# Patient Record
Sex: Male | Born: 1970 | Race: Black or African American | Hispanic: No | Marital: Married | Smoking: Former smoker
Health system: Southern US, Community
[De-identification: ages and names within clinical notes are randomized; demographics above are authoritative.]

## PROBLEM LIST (undated history)

## (undated) DIAGNOSIS — E669 Obesity, unspecified: Secondary | ICD-10-CM

## (undated) DIAGNOSIS — R12 Heartburn: Secondary | ICD-10-CM

## (undated) DIAGNOSIS — Z87891 Personal history of nicotine dependence: Secondary | ICD-10-CM

## (undated) DIAGNOSIS — W3400XA Accidental discharge from unspecified firearms or gun, initial encounter: Secondary | ICD-10-CM

## (undated) DIAGNOSIS — F1911 Other psychoactive substance abuse, in remission: Secondary | ICD-10-CM

## (undated) DIAGNOSIS — G4733 Obstructive sleep apnea (adult) (pediatric): Secondary | ICD-10-CM

## (undated) DIAGNOSIS — F1011 Alcohol abuse, in remission: Secondary | ICD-10-CM

## (undated) DIAGNOSIS — E119 Type 2 diabetes mellitus without complications: Secondary | ICD-10-CM

## (undated) DIAGNOSIS — M75121 Complete rotator cuff tear or rupture of right shoulder, not specified as traumatic: Secondary | ICD-10-CM

## (undated) DIAGNOSIS — I10 Essential (primary) hypertension: Secondary | ICD-10-CM

## (undated) DIAGNOSIS — S73006A Unspecified dislocation of unspecified hip, initial encounter: Secondary | ICD-10-CM

## (undated) DIAGNOSIS — Z8619 Personal history of other infectious and parasitic diseases: Secondary | ICD-10-CM

## (undated) DIAGNOSIS — Y249XXA Unspecified firearm discharge, undetermined intent, initial encounter: Secondary | ICD-10-CM

## (undated) DIAGNOSIS — E1169 Type 2 diabetes mellitus with other specified complication: Secondary | ICD-10-CM

## (undated) HISTORY — DX: Unspecified dislocation of unspecified hip, initial encounter: S73.006A

## (undated) HISTORY — DX: Accidental discharge from unspecified firearms or gun, initial encounter: W34.00XA

## (undated) HISTORY — DX: Type 2 diabetes mellitus with other specified complication: E11.69

## (undated) HISTORY — DX: Personal history of other infectious and parasitic diseases: Z86.19

## (undated) HISTORY — DX: Heartburn: R12

## (undated) HISTORY — DX: Alcohol abuse, in remission: F10.11

## (undated) HISTORY — DX: Other psychoactive substance abuse, in remission: F19.11

## (undated) HISTORY — DX: Obesity, unspecified: E66.9

## (undated) HISTORY — DX: Personal history of nicotine dependence: Z87.891

## (undated) HISTORY — DX: Unspecified firearm discharge, undetermined intent, initial encounter: Y24.9XXA

## (undated) HISTORY — DX: Complete rotator cuff tear or rupture of right shoulder, not specified as traumatic: M75.121

## (undated) HISTORY — DX: Obstructive sleep apnea (adult) (pediatric): G47.33

---

## 1991-07-26 DIAGNOSIS — S73006A Unspecified dislocation of unspecified hip, initial encounter: Secondary | ICD-10-CM

## 1991-07-26 HISTORY — DX: Unspecified dislocation of unspecified hip, initial encounter: S73.006A

## 2004-11-15 ENCOUNTER — Emergency Department (HOSPITAL_COMMUNITY): Admission: EM | Admit: 2004-11-15 | Discharge: 2004-11-16 | Payer: Self-pay | Admitting: Emergency Medicine

## 2004-12-20 ENCOUNTER — Emergency Department (HOSPITAL_COMMUNITY): Admission: EM | Admit: 2004-12-20 | Discharge: 2004-12-20 | Payer: Self-pay | Admitting: Emergency Medicine

## 2005-07-25 DIAGNOSIS — F1911 Other psychoactive substance abuse, in remission: Secondary | ICD-10-CM

## 2005-07-25 DIAGNOSIS — Z87891 Personal history of nicotine dependence: Secondary | ICD-10-CM

## 2005-07-25 DIAGNOSIS — F1011 Alcohol abuse, in remission: Secondary | ICD-10-CM

## 2005-07-25 HISTORY — DX: Personal history of nicotine dependence: Z87.891

## 2005-07-25 HISTORY — DX: Alcohol abuse, in remission: F10.11

## 2005-07-25 HISTORY — DX: Other psychoactive substance abuse, in remission: F19.11

## 2009-02-06 ENCOUNTER — Ambulatory Visit (HOSPITAL_COMMUNITY): Admission: RE | Admit: 2009-02-06 | Discharge: 2009-02-06 | Payer: Self-pay | Admitting: Cardiology

## 2010-03-05 ENCOUNTER — Ambulatory Visit (HOSPITAL_COMMUNITY): Admission: RE | Admit: 2010-03-05 | Discharge: 2010-03-05 | Payer: Self-pay | Admitting: Cardiology

## 2010-05-07 ENCOUNTER — Emergency Department (HOSPITAL_COMMUNITY): Admission: EM | Admit: 2010-05-07 | Discharge: 2010-05-07 | Payer: Self-pay | Admitting: Family Medicine

## 2010-10-06 LAB — POCT RAPID STREP A (OFFICE): Streptococcus, Group A Screen (Direct): POSITIVE — AB

## 2010-10-08 LAB — URINALYSIS, MICROSCOPIC ONLY
Glucose, UA: NEGATIVE mg/dL
Ketones, ur: NEGATIVE mg/dL
Protein, ur: NEGATIVE mg/dL
Specific Gravity, Urine: 1.027 (ref 1.005–1.030)
Urobilinogen, UA: 0.2 mg/dL (ref 0.0–1.0)
pH: 6 (ref 5.0–8.0)

## 2010-10-08 LAB — RENAL FUNCTION PANEL
Albumin: 3.9 g/dL (ref 3.5–5.2)
Calcium: 9.2 mg/dL (ref 8.4–10.5)
Chloride: 103 mEq/L (ref 96–112)
GFR calc non Af Amer: >60 mL/min (ref >60)
Glucose, Bld: 125 mg/dL — ABNORMAL HIGH (ref 70–99)
Phosphorus: 3.7 mg/dL (ref 2.3–4.6)
Sodium: 143 mEq/L (ref 135–145)

## 2010-10-08 LAB — URINE CULTURE: Culture: NO GROWTH

## 2011-12-21 ENCOUNTER — Other Ambulatory Visit: Payer: Self-pay | Admitting: Cardiology

## 2012-11-02 ENCOUNTER — Encounter (HOSPITAL_COMMUNITY): Payer: Self-pay | Admitting: Emergency Medicine

## 2012-11-02 ENCOUNTER — Emergency Department (INDEPENDENT_AMBULATORY_CARE_PROVIDER_SITE_OTHER)
Admission: EM | Admit: 2012-11-02 | Discharge: 2012-11-02 | Disposition: A | Discharge status: Home / Self Care | Payer: 59 | Source: Home / Self Care | Attending: Family Medicine | Admitting: Family Medicine

## 2012-11-02 DIAGNOSIS — S61209A Unspecified open wound of unspecified finger without damage to nail, initial encounter: Secondary | ICD-10-CM

## 2012-11-02 DIAGNOSIS — Z23 Encounter for immunization: Secondary | ICD-10-CM

## 2012-11-02 DIAGNOSIS — S61219A Laceration without foreign body of unspecified finger without damage to nail, initial encounter: Secondary | ICD-10-CM

## 2012-11-02 HISTORY — DX: Essential (primary) hypertension: I10

## 2012-11-02 MED ORDER — TETANUS-DIPHTH-ACELL PERTUSSIS 5-2.5-18.5 LF-MCG/0.5 IM SUSP
INTRAMUSCULAR | Status: AC
  Filled 2012-11-02: qty 0.5

## 2012-11-02 MED ORDER — CEPHALEXIN 500 MG PO CAPS: 500.0000 mg | ORAL_CAPSULE | Freq: Two times a day (BID) | ORAL | Status: DC

## 2012-11-02 MED ORDER — TETANUS-DIPHTH-ACELL PERTUSSIS 5-2.5-18.5 LF-MCG/0.5 IM SUSP
0.5000 mL | Freq: Once | INTRAMUSCULAR | Status: AC
  Administered 2012-11-02: 0.5 mL via INTRAMUSCULAR

## 2012-11-02 NOTE — ED Notes (Signed)
Laceration to right ring finger.  Cut finger last night around 8PM. Cut finger on metal banding on gutter.

## 2012-11-02 NOTE — ED Notes (Signed)
undischarged for finger splinting documentation

## 2012-11-02 NOTE — ED Notes (Signed)
Delay in discharge secondary to department acuity and influx, delay explained to patient

## 2012-11-02 NOTE — ED Provider Notes (Signed)
History     CSN: [id number]  Arrival date & time 11/02/12  1008   First MD Initiated Contact with Patient 11/02/12 1018      Chief Complaint  Patient presents with  . Laceration    (Consider location/radiation/quality/duration/timing/severity/associated sxs/prior treatment) HPI Comments: 42 year old male nondiabetic. Here complaining of right fourth finger laceration. Patient stated he was doing some home repairs and hit a gutter metal band with his right hand injuring his finger about 8 PM last night. Minimal bleeding self resolve. Not joint swelling, redness or pain. No numbness or tingling. Has not had a tetanus shot in last 10 years. Patient works as a Naval architect.   Past Medical History  Diagnosis Date  . Hypertension     History reviewed. No pertinent past surgical history.  No family history on file.  History  Substance Use Topics  . Smoking status: Never Smoker   . Smokeless tobacco: Not on file  . Alcohol Use: No      Review of Systems  Constitutional: Negative for chills and fatigue.  Musculoskeletal: Negative for joint swelling and arthralgias.  Skin: Positive for wound.       As per history of present illness per  Neurological: Negative for numbness.  All other systems reviewed and are negative.    Allergies  Review of patient's allergies indicates no known allergies.  Home Medications   Current Outpatient Rx  Name  Route  Sig  Dispense  Refill  . Valsartan-Hydrochlorothiazide (DIOVAN HCT PO)   Oral   Take by mouth.         . cephALEXin (KEFLEX) 500 MG capsule   Oral   Take 1 capsule (500 mg total) by mouth 2 (two) times daily.   14 capsule   0     BP 150/99  Pulse 75  Temp(Src) 97.7 F (36.5 C) (Oral)  Resp 22  SpO2 96%  Physical Exam  Vitals reviewed. Constitutional: He is oriented to person, place, and time. He appears well-developed and well-nourished. No distress.  HENT:  Head: Normocephalic and atraumatic.   Cardiovascular: Normal heart sounds.   Pulmonary/Chest: Breath sounds normal.  Musculoskeletal:  Right hand fourth finger: No redness swelling or deformity. Full range of motion. Specifically normal flexion and extension of the PIP. No joint erythema, swelling or tenderness to palpation.  Neurological: He is alert and oriented to person, place, and time.  Skin: He is not diaphoretic.  Right hand fourth finger: 1 cm linear horizontal superficial laceration over PIP. Hemostatic. Clean. Wound remains with good approximation of borders even during PIP full flexion.    ED Course  LACERATION REPAIR Date/Time: 11/04/2012 8:22 AM Performed by: Sharin Grave Authorized by: Sharin Grave Consent: Verbal consent obtained. Risks and benefits: risks, benefits and alternatives were discussed Consent given by: patient Patient understanding: patient states understanding of the procedure being performed Patient consent: the patient's understanding of the procedure matches consent given Body area: upper extremity Location details: right ring finger Laceration length: 1 cm Foreign bodies: no foreign bodies Tendon involvement: none Nerve involvement: none Vascular damage: no Preparation: Patient was prepped and draped in the usual sterile fashion. Irrigation solution: soaked in saline and cleaning soap then irrigated with saline. Debridement: none Degree of undermining: none Skin closure: glue Dressing: splint Patient tolerance: Patient tolerated the procedure well with no immediate complications.   (including critical care time)  Labs Reviewed - No data to display No results found.   1. Finger laceration, initial encounter  MDM  Superficial laceration. Explained to patient that his wound can complete heal without sutures as is not deep and borders remain approximated even during PIP joint flexion. Patient favored wound repair. I applied Dermabond as is my impression that  regular sutures would cause more harm than benefit. As per patient request he was placed in a finger splint. I asked to remove it in 2 days. Patient asked or requested antibiotics and I complied and prescribed cephalexin. Supportive care including wound care instructions discussed with patient and provided in writing. Specifically patient was asked to return if redness swelling or drainage.     Sharin Grave, MD 11/04/12 [phone]

## 2012-11-02 NOTE — ED Notes (Signed)
Note changed for patient preference and physician agreed

## 2012-11-02 NOTE — ED Notes (Signed)
Patient requested to go to work today if possible, questioned dr Alfonse Ras, agreed for patient to go back to work today

## 2013-06-24 ENCOUNTER — Emergency Department (INDEPENDENT_AMBULATORY_CARE_PROVIDER_SITE_OTHER)
Admission: EM | Admit: 2013-06-24 | Discharge: 2013-06-24 | Disposition: A | Discharge status: Home / Self Care | Payer: 59 | Source: Home / Self Care | Attending: Family Medicine | Admitting: Family Medicine

## 2013-06-24 ENCOUNTER — Encounter (HOSPITAL_COMMUNITY): Payer: Self-pay | Admitting: Emergency Medicine

## 2013-06-24 DIAGNOSIS — J209 Acute bronchitis, unspecified: Secondary | ICD-10-CM

## 2013-06-24 MED ORDER — MINOCYCLINE HCL 100 MG PO CAPS: 100.0000 mg | ORAL_CAPSULE | Freq: Two times a day (BID) | ORAL | Status: DC

## 2013-06-24 NOTE — ED Provider Notes (Signed)
CSN: [id number]     Arrival date & time 06/24/13  1145 History   First MD Initiated Contact with Patient 06/24/13 1314     Chief Complaint  Patient presents with  . Cough   (Consider location/radiation/quality/duration/timing/severity/associated sxs/prior Treatment) Patient is a 42 y.o. male presenting with cough. The history is provided by the patient.  Cough Cough characteristics:  Productive Sputum characteristics:  Yellow Severity:  Mild Onset quality:  Gradual Duration:  2 days Progression:  Unchanged Chronicity:  New Smoker: no   Context: upper respiratory infection   Associated symptoms: no chills, no fever, no shortness of breath and no wheezing     Past Medical History  Diagnosis Date  . Hypertension    History reviewed. No pertinent past surgical history. No family history on file. History  Substance Use Topics  . Smoking status: Never Smoker   . Smokeless tobacco: Not on file  . Alcohol Use: No    Review of Systems  Constitutional: Negative.  Negative for fever and chills.  HENT: Negative for congestion and postnasal drip.   Respiratory: Positive for cough. Negative for shortness of breath and wheezing.     Allergies  Review of patient's allergies indicates no known allergies.  Home Medications   Current Outpatient Rx  Name  Route  Sig  Dispense  Refill  . cephALEXin (KEFLEX) 500 MG capsule   Oral   Take 1 capsule (500 mg total) by mouth 2 (two) times daily.   14 capsule   0   . minocycline (MINOCIN,DYNACIN) 100 MG capsule   Oral   Take 1 capsule (100 mg total) by mouth 2 (two) times daily.   20 capsule   0   . Valsartan-Hydrochlorothiazide (DIOVAN HCT PO)   Oral   Take by mouth.          BP 162/102  Pulse 82  Temp(Src) 98.4 F (36.9 C) (Oral)  Resp 16  SpO2 94% Physical Exam  Nursing note and vitals reviewed. Constitutional: He is oriented to person, place, and time. He appears well-developed and well-nourished.  HENT:  Head:  Normocephalic.  Right Ear: External ear normal.  Left Ear: External ear normal.  Mouth/Throat: Oropharynx is clear and moist.  Eyes: Conjunctivae are normal. Pupils are equal, round, and reactive to light.  Neck: Normal range of motion. Neck supple.  Cardiovascular: Normal heart sounds and intact distal pulses.   Pulmonary/Chest: Effort normal and breath sounds normal.  Abdominal: Soft. Bowel sounds are normal.  Lymphadenopathy:    He has no cervical adenopathy.  Neurological: He is alert and oriented to person, place, and time.  Skin: Skin is warm and dry.    ED Course  Procedures (including critical care time) Labs Review Labs Reviewed - No data to display Imaging Review No results found.  EKG Interpretation    Date/Time:    Ventricular Rate:    PR Interval:    QRS Duration:   QT Interval:    QTC Calculation:   R Axis:     Text Interpretation:              MDM      Linna Hoff, MD 06/24/13 1350

## 2013-06-24 NOTE — ED Notes (Signed)
Dr Artis Flock declined work note for time off, provided note reflecting patient seen today

## 2013-06-24 NOTE — ED Notes (Signed)
Patient c/o cough, particularly painful cough at night.  Onset 11/29

## 2013-06-24 NOTE — Discharge Instructions (Signed)
Drink plenty of fluids as discussed, use medicine as prescribed, and mucinex or delsym for cough. Return or see your doctor if further problems °

## 2013-07-01 ENCOUNTER — Encounter: Payer: Self-pay | Admitting: Family Medicine

## 2013-07-01 ENCOUNTER — Ambulatory Visit (INDEPENDENT_AMBULATORY_CARE_PROVIDER_SITE_OTHER): Payer: 59 | Admitting: Family Medicine

## 2013-07-01 VITALS — BP 132/90 | HR 80 | Temp 98.0°F | Ht 75.0 in | Wt 322.0 lb

## 2013-07-01 DIAGNOSIS — Z0001 Encounter for general adult medical examination with abnormal findings: Secondary | ICD-10-CM | POA: Insufficient documentation

## 2013-07-01 DIAGNOSIS — Z0000 Encounter for general adult medical examination without abnormal findings: Secondary | ICD-10-CM | POA: Insufficient documentation

## 2013-07-01 DIAGNOSIS — E669 Obesity, unspecified: Secondary | ICD-10-CM

## 2013-07-01 DIAGNOSIS — I10 Essential (primary) hypertension: Secondary | ICD-10-CM | POA: Insufficient documentation

## 2013-07-01 NOTE — Patient Instructions (Signed)
Good to meet you today, call us with quesitons. Bring me form to fill out.  Return at your convenience fasting for blood work.

## 2013-07-01 NOTE — Assessment & Plan Note (Signed)
Body mass index is 40.25 kg/(m^2).  Encouraged restart exercise regimen - pt motivated. Discussed recommended 148min/week moderate intensity exercise.

## 2013-07-01 NOTE — Assessment & Plan Note (Signed)
Chronic, stable. Continue meds. 

## 2013-07-01 NOTE — Progress Notes (Signed)
Pre-visit discussion using our clinic review tool. No additional management support is needed unless otherwise documented below in the visit note.  

## 2013-07-01 NOTE — Assessment & Plan Note (Signed)
Preventative protocols reviewed and updated unless pt declined. Discussed healthy diet and lifestyle.  
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
## 2013-07-08 ENCOUNTER — Other Ambulatory Visit (INDEPENDENT_AMBULATORY_CARE_PROVIDER_SITE_OTHER): Payer: 59

## 2013-07-08 DIAGNOSIS — E669 Obesity, unspecified: Secondary | ICD-10-CM

## 2013-07-08 DIAGNOSIS — I10 Essential (primary) hypertension: Secondary | ICD-10-CM

## 2013-07-08 LAB — LIPID PANEL
HDL: 26.4 mg/dL — ABNORMAL LOW (ref >39.00)
LDL Cholesterol: 75 mg/dL (ref 0–99)
Total CHOL/HDL Ratio: 5

## 2013-07-08 LAB — BASIC METABOLIC PANEL
BUN: 19 mg/dL (ref 6–23)
CO2: 28 mEq/L (ref 19–32)
Potassium: 4.2 mEq/L (ref 3.5–5.1)

## 2013-07-09 ENCOUNTER — Encounter: Payer: Self-pay | Admitting: *Deleted

## 2013-07-09 ENCOUNTER — Other Ambulatory Visit: Payer: Self-pay | Admitting: Family Medicine

## 2013-12-02 ENCOUNTER — Other Ambulatory Visit: Payer: Self-pay | Admitting: Family Medicine

## 2013-12-30 ENCOUNTER — Ambulatory Visit (INDEPENDENT_AMBULATORY_CARE_PROVIDER_SITE_OTHER): Payer: 59 | Admitting: Family Medicine

## 2013-12-30 ENCOUNTER — Encounter (INDEPENDENT_AMBULATORY_CARE_PROVIDER_SITE_OTHER): Payer: Self-pay

## 2013-12-30 ENCOUNTER — Encounter: Payer: Self-pay | Admitting: Family Medicine

## 2013-12-30 VITALS — BP 124/84 | HR 80 | Temp 98.1°F | Ht 75.0 in | Wt 321.0 lb

## 2013-12-30 DIAGNOSIS — I10 Essential (primary) hypertension: Secondary | ICD-10-CM

## 2013-12-30 MED ORDER — OLMESARTAN-AMLODIPINE-HCTZ 40-5-12.5 MG PO TABS: ORAL_TABLET | ORAL | Status: DC

## 2013-12-30 NOTE — Progress Notes (Signed)
   BP 124/84  Pulse 80  Temp(Src) 98.1 F (36.7 C) (Oral)  Ht 6\' 3"  (1.905 m)  Wt 321 lb (145.605 kg)  BMI 40.12 kg/m2   CC: CPE  Subjective:    Patient ID: Jonathan Compton, male    DOB: 03/12/1971, 43 y.o.   MRN: [id number]  HPI: Jonathan Compton is a 43 y.o. male presenting on 12/30/2013 for Annual Exam   Last CPE was 06/2013. Actually too early for CPE. So visit converted to 6 mo f/u visit. No exercise routine yet. Diet - increased fiber, eating more raisin bran. Prior to this was possibly more constipated.  HTN - Compliant with current antihypertensive regimen of tribenzor daily.  Does not check blood pressures at home.  No low blood pressure symptoms of syncope.  Denies HA, vision changes, CP/tightness, SOB, leg swelling. Dizzy x 1 a few weeks ago, quickly resolved.  Morbid obesity - Body mass index is 40.12 kg/(m^2). reviewed recommendation for daily exercise regimen.  Wt Readings from Last 3 Encounters:  12/30/13 321 lb (145.605 kg)  07/01/13 322 lb (146.058 kg)    Preventative: Flu - declines  Tetanus - 10/2012   Lives with wife, daughter (19) and mother  Occupation: crew member  Edu: HS  Activity: no regular exercise, motivated to restart  Diet: good water, fruits/vegetables daily, has nutribullet   Relevant past medical, surgical, family and social history reviewed and updated as indicated.  Allergies and medications reviewed and updated. No current outpatient prescriptions on file prior to visit.   No current facility-administered medications on file prior to visit.    Review of Systems Per HPI unless specifically indicated above    Objective:    BP 124/84  Pulse 80  Temp(Src) 98.1 F (36.7 C) (Oral)  Ht 6\' 3"  (1.905 m)  Wt 321 lb (145.605 kg)  BMI 40.12 kg/m2  Physical Exam  Nursing note and vitals reviewed. Constitutional: He appears well-developed and well-nourished. No distress.  HENT:  Mouth/Throat: Oropharynx is clear and moist. No  oropharyngeal exudate.  Neck: No thyromegaly present.  Cardiovascular: Normal rate, regular rhythm, normal heart sounds and intact distal pulses.   No murmur heard. Pulmonary/Chest: Effort normal and breath sounds normal. No respiratory distress. He has no wheezes. He has no rales.  Musculoskeletal: He exhibits no edema.  Skin: Skin is warm and dry. No rash noted.  Psychiatric: He has a normal mood and affect.       Assessment & Plan:   Problem List Items Addressed This Visit   Morbid obesity     Body mass index is 40.12 kg/(m^2). Reviewed BMI and recommended 80-100lb weight loss for ideal weight at his height. Encouraged start regular exercise regimen. Pt will consider this.    Hypertension - Primary     Chronic, stable on 3 med regimen. No changes today. Med refilled.    Relevant Medications      Olmesartan-Amlodipine-HCTZ (TRIBENZOR) 40-5-12.5 MG TABS       Follow up plan: Return in about 6 months (around 07/01/2014), or as needed, for annual exam, prior fasting for blood work.

## 2013-12-30 NOTE — Progress Notes (Signed)
Pre visit review using our clinic review tool, if applicable. No additional management support is needed unless otherwise documented below in the visit note. 

## 2013-12-30 NOTE — Assessment & Plan Note (Signed)
Body mass index is 40.12 kg/(m^2). Reviewed BMI and recommended 80-100lb weight loss for ideal weight at his height. Encouraged start regular exercise regimen. Pt will consider this.

## 2013-12-30 NOTE — Patient Instructions (Signed)
Refills today. Good to see you Work on regular exercise routine for weight loss. Return in 6 months for physical and prior or day of fasting for blood work.

## 2013-12-30 NOTE — Assessment & Plan Note (Signed)
Chronic, stable on 3 med regimen. No changes today. Med refilled.

## 2013-12-31 ENCOUNTER — Telehealth: Payer: Self-pay | Admitting: Family Medicine

## 2013-12-31 NOTE — Telephone Encounter (Signed)
Relevant patient education assigned to patient using Emmi. ° °

## 2014-07-11 ENCOUNTER — Ambulatory Visit: Payer: 59 | Admitting: Family Medicine

## 2014-07-22 ENCOUNTER — Encounter: Payer: Self-pay | Admitting: Family Medicine

## 2014-07-22 ENCOUNTER — Ambulatory Visit (INDEPENDENT_AMBULATORY_CARE_PROVIDER_SITE_OTHER): Payer: 59 | Admitting: Family Medicine

## 2014-07-22 VITALS — BP 142/94 | HR 84 | Temp 98.0°F | Wt 328.5 lb

## 2014-07-22 DIAGNOSIS — I10 Essential (primary) hypertension: Secondary | ICD-10-CM

## 2014-07-22 DIAGNOSIS — K219 Gastro-esophageal reflux disease without esophagitis: Secondary | ICD-10-CM | POA: Insufficient documentation

## 2014-07-22 NOTE — Assessment & Plan Note (Signed)
Reviewed dietary choices to improve sxs. Encouraged weight loss

## 2014-07-22 NOTE — Patient Instructions (Addendum)
Goal blood pressure <140/90, start monitoring and let me know if persistently elevated for change in regimen. For heartburn - Head of bed elevated. Avoidance of citrus, fatty foods, chocolate, peppermint, and excessive alcohol, along with sodas, orange juice (acidic drinks) At least a few hours between dinner and bed, minimize naps after eating. Work on weight loss. Avoid salt in diet. Increase fruits/vegetables in diet. Blood work today. Return in 6 months for physical

## 2014-07-22 NOTE — Assessment & Plan Note (Signed)
Chronic, stable. Continue tribenzor. Pt has coupon for this med. Advised start monitoring bp at home and notify me if persistently elevated.

## 2014-07-22 NOTE — Progress Notes (Signed)
Pre visit review using our clinic review tool, if applicable. No additional management support is needed unless otherwise documented below in the visit note. 

## 2014-07-22 NOTE — Assessment & Plan Note (Signed)
Body mass index is 41.06 kg/(m^2).  Continue to encourage weight loss through healthy diet and lifestyle choices.

## 2014-07-22 NOTE — Progress Notes (Signed)
   BP 142/94 mmHg  Pulse 84  Temp(Src) 98 F (36.7 C) (Oral)  Wt 328 lb 8 oz (149.007 kg)   CC: 6 mo f/u   Subjective:    Patient ID: Jonathan Compton, male    DOB: [birth year], 43 y.o.   MRN: [id number]  HPI: Jonathan Compton is a 43 y.o. male presenting on 07/22/2014 for Follow-up   HTN - Compliant with current antihypertensive regimen of tribenzor 40/5/12.5mg .  Does not check blood pressures at home.  No low blood pressure readings or symptoms of dizziness/syncope.  Denies HA, vision changes, SOB, leg swelling.  Some intermittent reflux and sharp chest pains. Not exertional. Has not tried medication for this.   Obesity - weight gain noted, discussed. Reviewed dietary choices. Making healthy diet choices - prepared meals rather than convenience meals. Reading labels.  Declines flu shots.   Relevant past medical, surgical, family and social history reviewed and updated as indicated. Interim medical history since our last visit reviewed. Allergies and medications reviewed and updated.  Current Outpatient Prescriptions on File Prior to Visit  Medication Sig  . Olmesartan-Amlodipine-HCTZ (TRIBENZOR) 40-5-12.5 MG TABS TAKE 1 TABLET BY MOUTH EVERY DAY   No current facility-administered medications on file prior to visit.    Review of Systems Per HPI unless specifically indicated above     Objective:    BP 142/94 mmHg  Pulse 84  Temp(Src) 98 F (36.7 C) (Oral)  Wt 328 lb 8 oz (149.007 kg)  Wt Readings from Last 3 Encounters:  07/22/14 328 lb 8 oz (149.007 kg)  12/30/13 321 lb (145.605 kg)  07/01/13 322 lb (146.058 kg)    Physical Exam  Constitutional: He appears well-developed and well-nourished. No distress.  Morbidly obese  HENT:  Mouth/Throat: Oropharynx is clear and moist. No oropharyngeal exudate.  Neck: No thyromegaly present.  Cardiovascular: Normal rate, regular rhythm, normal heart sounds and intact distal pulses.   No murmur heard. Pulmonary/Chest: Effort  normal and breath sounds normal. No respiratory distress. He has no wheezes. He has no rales.  Musculoskeletal: He exhibits no edema.  Psychiatric: He has a normal mood and affect.  Nursing note and vitals reviewed.      Assessment & Plan:   Problem List Items Addressed This Visit    Morbid obesity    Body mass index is 41.06 kg/(m^2).  Continue to encourage weight loss through healthy diet and lifestyle choices.    Hypertension - Primary    Chronic, stable. Continue tribenzor. Pt has coupon for this med. Advised start monitoring bp at home and notify me if persistently elevated.    Relevant Orders      Basic metabolic panel      TSH   GERD (gastroesophageal reflux disease)    Reviewed dietary choices to improve sxs. Encouraged weight loss        Follow up plan: Return in about 6 months (around 01/21/2015), or as needed, for annual exam, prior fasting for blood work.

## 2014-07-23 ENCOUNTER — Encounter: Payer: Self-pay | Admitting: *Deleted

## 2014-07-23 LAB — BASIC METABOLIC PANEL
BUN: 17 mg/dL (ref 6–23)
CHLORIDE: 106 meq/L (ref 96–112)
CO2: 29 mEq/L (ref 19–32)
CREATININE: 0.9 mg/dL (ref 0.4–1.5)
Calcium: 9.5 mg/dL (ref 8.4–10.5)
GFR: 116.24 mL/min (ref >60.00)
GLUCOSE: 78 mg/dL (ref 70–99)
POTASSIUM: 4.2 meq/L (ref 3.5–5.1)
Sodium: 140 mEq/L (ref 135–145)

## 2014-07-23 LAB — TSH: TSH: 1.59 u[IU]/mL (ref 0.35–4.50)

## 2015-01-04 ENCOUNTER — Other Ambulatory Visit: Payer: Self-pay | Admitting: Family Medicine

## 2015-02-05 ENCOUNTER — Other Ambulatory Visit: Payer: Self-pay | Admitting: Family Medicine

## 2015-02-05 ENCOUNTER — Encounter: Payer: Self-pay | Admitting: Family Medicine

## 2015-02-05 ENCOUNTER — Ambulatory Visit (INDEPENDENT_AMBULATORY_CARE_PROVIDER_SITE_OTHER): Payer: Self-pay | Admitting: Family Medicine

## 2015-02-05 VITALS — BP 136/98 | HR 78 | Temp 97.7°F | Wt 323.0 lb

## 2015-02-05 DIAGNOSIS — R0789 Other chest pain: Secondary | ICD-10-CM | POA: Insufficient documentation

## 2015-02-05 DIAGNOSIS — I10 Essential (primary) hypertension: Secondary | ICD-10-CM

## 2015-02-05 DIAGNOSIS — R079 Chest pain, unspecified: Secondary | ICD-10-CM | POA: Insufficient documentation

## 2015-02-05 MED ORDER — OLMESARTAN-AMLODIPINE-HCTZ 40-5-25 MG PO TABS: 1.0000 | ORAL_TABLET | Freq: Every day | ORAL | Status: DC

## 2015-02-05 NOTE — Progress Notes (Signed)
   BP 136/98 mmHg  Pulse 78  Temp(Src) 97.7 F (36.5 C) (Oral)  Wt 323 lb (146.512 kg)   CC: 59mo f/u visit  Subjective:    Patient ID: Jonathan Compton, male    DOB: [birth year], 44 y.o.   MRN: [id number]  HPI: Jonathan HittCharles A Ringer is a 44 y.o. male presenting on 02/05/2015 for Follow-up   HTN - Compliant with current antihypertensive regimen of tribenzor 40/5/12.5mg  daily. bp remaining elevated over last few readings in office. Does not check blood pressures at home.  No low blood pressure readings or symptoms of dizziness/syncope.  Denies HA, vision changes, SOB, leg swelling.   BP Readings from Last 3 Encounters:  02/05/15 136/98  07/22/14 142/94  12/30/13 124/84    Obesity - 5 lb weight loss, congratulated. Eating bran cereal for breakfast. 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   Occasional sharp left sided chest pain that comes on at rest. This happens every few weeks. Not exertional or relieved by rest. Last 10-15 seconds. No dyspnea or nausea with this. No burning pain. Not reproducible. Wonders if this is gas related.   Relevant past medical, surgical, family and social history reviewed and updated as indicated. Interim medical history since our last visit reviewed. Allergies and medications reviewed and updated. No current outpatient prescriptions on file prior to visit.   No current facility-administered medications on file prior to visit.    Review of Systems Per HPI unless specifically indicated above     Objective:    BP 136/98 mmHg  Pulse 78  Temp(Src) 97.7 F (36.5 C) (Oral)  Wt 323 lb (146.512 kg)  Wt Readings from Last 3 Encounters:  02/05/15 323 lb (146.512 kg)  07/22/14 328 lb 8 oz (149.007 kg)  12/30/13 321 lb (145.605 kg)    Physical Exam  Constitutional: He appears well-developed and well-nourished. No distress.  HENT:  Mouth/Throat: Oropharynx is clear and moist. No oropharyngeal exudate.    Cardiovascular: Normal rate, regular rhythm, normal heart sounds and intact distal pulses.   No murmur heard. Pulmonary/Chest: Effort normal and breath sounds normal. No respiratory distress. He has no wheezes. He has no rales.  Musculoskeletal: He exhibits no edema.  Skin: Skin is warm and dry. No rash noted.  Psychiatric: He has a normal mood and affect.  Nursing note and vitals reviewed.     Assessment & Plan:  Will return for CPE in 3 months. Problem List Items Addressed This Visit    Chest pain    Does not sound cardiac in nature.  Anticipate gas related pain. rec try GasX. Check baseline EKG next visit.      Hypertension - Primary    bp remains elevated - increase tribenzor to 40/5/25. Pt has coupon. Recommended mediterranean diet.      Relevant Medications   Olmesartan-Amlodipine-HCTZ 40-5-25 MG TABS   Morbid obesity    Discussed healthy diet and lifestyle changes to affect sustainable weight loss. Recommended mediterranean diet. Body mass index is 40.37 kg/(m^2).           Follow up plan: Return in about 3 months (around 05/08/2015), or as needed, for annual exam, prior fasting for blood work.

## 2015-02-05 NOTE — Assessment & Plan Note (Addendum)
Does not sound cardiac in nature.  Anticipate gas related pain. rec try GasX. Check baseline EKG next visit.

## 2015-02-05 NOTE — Assessment & Plan Note (Signed)
bp remains elevated - increase tribenzor to 40/5/25. Pt has coupon. Recommended mediterranean diet.

## 2015-02-05 NOTE — Progress Notes (Signed)
Pre visit review using our clinic review tool, if applicable. No additional management support is needed unless otherwise documented below in the visit note. 

## 2015-02-05 NOTE — Assessment & Plan Note (Signed)
Discussed healthy diet and lifestyle changes to affect sustainable weight loss. Recommended mediterranean diet. Body mass index is 40.37 kg/(m^2).

## 2015-02-05 NOTE — Patient Instructions (Addendum)
Increase tribenzor to 40/5/25mg  daily. Sent new medicine to your pharmacy. Look into mediterranean diet - decreases cardiovascular risk overall. Start incorporating regular walking into regimen.      Mediterranean Diet  Why follow it? Research shows. . Those who follow the Mediterranean diet have a reduced risk of heart disease  . The diet is associated with a reduced incidence of Parkinson's and Alzheimer's diseases . People following the diet may have longer life expectancies and lower rates of chronic diseases  . The Dietary Guidelines for Americans recommends the Mediterranean diet as an eating plan to promote health and prevent disease  What Is the Mediterranean Diet?  . Healthy eating plan based on typical foods and recipes of Mediterranean-style cooking . The diet is primarily a plant based diet; these foods should make up a majority of meals   Starches - Plant based foods should make up a majority of meals - They are an important sources of vitamins, minerals, energy, antioxidants, and fiber - Choose whole grains, foods high in fiber and minimally processed items  - Typical grain sources include wheat, oats, barley, corn, brown rice, bulgar, farro, millet, polenta, couscous  - Various types of beans include chickpeas, lentils, fava beans, black beans, white beans   Fruits  Veggies - Large quantities of antioxidant rich fruits & veggies; 6 or more servings  - Vegetables can be eaten raw or lightly drizzled with oil and cooked  - Vegetables common to the traditional Mediterranean Diet include: artichokes, arugula, beets, broccoli, brussel sprouts, cabbage, carrots, celery, collard greens, cucumbers, eggplant, kale, leeks, lemons, lettuce, mushrooms, okra, onions, peas, peppers, potatoes, pumpkin, radishes, rutabaga, shallots, spinach, sweet potatoes, turnips, zucchini - Fruits common to the Mediterranean Diet include: apples, apricots, avocados, cherries, clementines, dates, figs,  grapefruits, grapes, melons, nectarines, oranges, peaches, pears, pomegranates, strawberries, tangerines  Fats - Replace butter and margarine with healthy oils, such as olive oil, canola oil, and tahini  - Limit nuts to no more than a handful a day  - Nuts include walnuts, almonds, pecans, pistachios, pine nuts  - Limit or avoid candied, honey roasted or heavily salted nuts - Olives are central to the [employer] - can be eaten whole or used in a variety of dishes   Meats Protein - Limiting red meat: no more than a few times a month - When eating red meat: choose lean cuts and keep the portion to the size of deck of cards - Eggs: approx. 0 to 4 times a week  - Fish and lean poultry: at least 2 a week  - Healthy protein sources include, chicken, [place], lean beef, lamb - Increase intake of seafood such as tuna, salmon, trout, mackerel, shrimp, scallops - Avoid or limit high fat processed meats such as sausage and bacon  Dairy - Include moderate amounts of low fat dairy products  - Focus on healthy dairy such as fat free yogurt, skim milk, low or reduced fat cheese - Limit dairy products higher in fat such as whole or 2% milk, cheese, ice cream  Alcohol - Moderate amounts of red wine is ok  - No more than 5 oz daily for women (all ages) and men older than age 56  - No more than 10 oz of wine daily for men younger than 73  Other - Limit sweets and other desserts  - Use herbs and spices instead of salt to flavor foods  - Herbs and spices common to the traditional Mediterranean Diet include: basil, bay  leaves, chives, cloves, cumin, fennel, garlic, lavender, marjoram, mint, oregano, parsley, pepper, rosemary, sage, savory, sumac, tarragon, thyme   It's not just a diet, it's a lifestyle:  . The Mediterranean diet includes lifestyle factors typical of those in the region  . Foods, drinks and meals are best eaten with others and savored . Daily physical activity is important for overall good  health . This could be strenuous exercise like running and aerobics . This could also be more leisurely activities such as walking, housework, yard-work, or taking the stairs . Moderation is the key; a balanced and healthy diet accommodates most foods and drinks . Consider portion sizes and frequency of consumption of certain foods   Meal Ideas & Options:  . Breakfast:  o Whole wheat toast or whole wheat English muffins with peanut butter & hard boiled egg o Steel cut oats topped with apples & cinnamon and skim milk  o Fresh fruit: banana, strawberries, melon, berries, peaches  o Smoothies: strawberries, bananas, greek yogurt, peanut butter o Low fat greek yogurt with blueberries and granola  o Egg white omelet with spinach and mushrooms o Breakfast couscous: whole wheat couscous, apricots, skim milk, cranberries  . Sandwiches:  o Hummus and grilled vegetables (peppers, zucchini, squash) on whole wheat bread   o Grilled chicken on whole wheat pita with lettuce, tomatoes, cucumbers or tzatziki  o Tuna salad on whole wheat bread: tuna salad made with greek yogurt, olives, red peppers, capers, green onions o Garlic rosemary lamb pita: lamb sauted with garlic, rosemary, salt & pepper; add lettuce, cucumber, greek yogurt to pita - flavor with lemon juice and black pepper  . Seafood:  o Mediterranean grilled salmon, seasoned with garlic, basil, parsley, lemon juice and black pepper o Shrimp, lemon, and spinach whole-grain pasta salad made with low fat greek yogurt  o Seared scallops with lemon orzo  o Seared tuna steaks seasoned salt, pepper, coriander topped with tomato mixture of olives, tomatoes, olive oil, minced garlic, parsley, green onions and cappers  . Meats:  o Herbed greek chicken salad with kalamata olives, cucumber, feta  o Red bell peppers stuffed with spinach, bulgur, lean ground beef (or lentils) & topped with feta   o Kebabs: skewers of chicken, tomatoes, onions, zucchini,  squash  o [place] burgers: made with red onions, mint, dill, lemon juice, feta cheese topped with roasted red peppers . Vegetarian o Cucumber salad: cucumbers, artichoke hearts, celery, red onion, feta cheese, tossed in olive oil & lemon juice  o Hummus and whole grain pita points with a greek salad (lettuce, tomato, feta, olives, cucumbers, red onion) o Lentil soup with celery, carrots made with vegetable broth, garlic, salt and pepper  o Tabouli salad: parsley, bulgur, mint, scallions, cucumbers, tomato, radishes, lemon juice, olive oil, salt and pepper.

## 2015-07-26 DIAGNOSIS — M75121 Complete rotator cuff tear or rupture of right shoulder, not specified as traumatic: Secondary | ICD-10-CM

## 2015-07-26 HISTORY — DX: Complete rotator cuff tear or rupture of right shoulder, not specified as traumatic: M75.121

## 2015-08-12 ENCOUNTER — Other Ambulatory Visit: Payer: Self-pay | Admitting: Nurse Practitioner

## 2015-08-12 ENCOUNTER — Ambulatory Visit
Admission: RE | Admit: 2015-08-12 | Discharge: 2015-08-12 | Disposition: A | Discharge status: Home / Self Care | Payer: Worker's Compensation | Source: Ambulatory Visit | Attending: Nurse Practitioner | Admitting: Nurse Practitioner

## 2015-08-12 DIAGNOSIS — S4991XA Unspecified injury of right shoulder and upper arm, initial encounter: Secondary | ICD-10-CM

## 2015-08-27 ENCOUNTER — Other Ambulatory Visit: Payer: Self-pay | Admitting: Orthopedic Surgery

## 2015-08-27 DIAGNOSIS — M25511 Pain in right shoulder: Secondary | ICD-10-CM

## 2015-09-04 ENCOUNTER — Ambulatory Visit
Admission: RE | Admit: 2015-09-04 | Discharge: 2015-09-04 | Disposition: A | Discharge status: Home / Self Care | Payer: Commercial Managed Care - HMO | Source: Ambulatory Visit | Attending: Orthopedic Surgery | Admitting: Orthopedic Surgery

## 2015-09-04 DIAGNOSIS — M25511 Pain in right shoulder: Secondary | ICD-10-CM

## 2015-09-10 ENCOUNTER — Encounter: Payer: Self-pay | Admitting: Family Medicine

## 2015-09-11 ENCOUNTER — Other Ambulatory Visit (INDEPENDENT_AMBULATORY_CARE_PROVIDER_SITE_OTHER): Payer: Commercial Managed Care - HMO

## 2015-09-11 ENCOUNTER — Telehealth: Payer: Self-pay | Admitting: Family Medicine

## 2015-09-11 ENCOUNTER — Other Ambulatory Visit: Payer: Self-pay | Admitting: Family Medicine

## 2015-09-11 ENCOUNTER — Encounter: Payer: Self-pay | Admitting: Family Medicine

## 2015-09-11 DIAGNOSIS — I10 Essential (primary) hypertension: Secondary | ICD-10-CM

## 2015-09-11 DIAGNOSIS — Z01818 Encounter for other preprocedural examination: Secondary | ICD-10-CM

## 2015-09-11 LAB — LIPID PANEL
CHOL/HDL RATIO: 5
CHOLESTEROL: 146 mg/dL (ref 0–200)
HDL: 30.8 mg/dL — ABNORMAL LOW (ref >39.00)
LDL Cholesterol: 84 mg/dL (ref 0–99)
NonHDL: 115.65
TRIGLYCERIDES: 157 mg/dL — AB (ref 0.0–149.0)
VLDL: 31.4 mg/dL (ref 0.0–40.0)

## 2015-09-11 LAB — BASIC METABOLIC PANEL
BUN: 22 mg/dL (ref 6–23)
CALCIUM: 9.4 mg/dL (ref 8.4–10.5)
CO2: 28 mEq/L (ref 19–32)
CREATININE: 0.91 mg/dL (ref 0.40–1.50)
Chloride: 104 mEq/L (ref 96–112)
GFR: 115.65 mL/min (ref >60.00)
GLUCOSE: 87 mg/dL (ref 70–99)
Potassium: 3.7 mEq/L (ref 3.5–5.1)
Sodium: 139 mEq/L (ref 135–145)

## 2015-09-11 LAB — CBC WITH DIFFERENTIAL/PLATELET
BASOS PCT: 0.5 % (ref 0.0–3.0)
Basophils Absolute: 0 10*3/uL (ref 0.0–0.1)
EOS ABS: 0.1 10*3/uL (ref 0.0–0.7)
EOS PCT: 0.9 % (ref 0.0–5.0)
HEMATOCRIT: 38.4 % — AB (ref 39.0–52.0)
HEMOGLOBIN: 12.4 g/dL — AB (ref 13.0–17.0)
LYMPHS PCT: 31 % (ref 12.0–46.0)
Lymphs Abs: 2.9 10*3/uL (ref 0.7–4.0)
MCHC: 32.3 g/dL (ref 30.0–36.0)
MCV: 87.5 fl (ref 78.0–100.0)
MONOS PCT: 6.3 % (ref 3.0–12.0)
Monocytes Absolute: 0.6 10*3/uL (ref 0.1–1.0)
NEUTROS ABS: 5.6 10*3/uL (ref 1.4–7.7)
Neutrophils Relative %: 61.3 % (ref 43.0–77.0)
PLATELETS: 243 10*3/uL (ref 150.0–400.0)
RBC: 4.39 Mil/uL (ref 4.22–5.81)
RDW: 13.3 % (ref 11.5–15.5)
WBC: 9.2 10*3/uL (ref 4.0–10.5)

## 2015-09-11 NOTE — Telephone Encounter (Signed)
Received request for clearance prior to upcoming right shoulder surgery. plz have pt schedule CPE as due - or if he prefers just 15 min appt for cards clearance as I want to get EKG prior to surgery.

## 2015-09-11 NOTE — Telephone Encounter (Signed)
Appt scheduled

## 2015-09-11 NOTE — Telephone Encounter (Signed)
Message left advising patient to call and schedule appt for CPE/Surgical clearance.

## 2015-09-15 ENCOUNTER — Ambulatory Visit (INDEPENDENT_AMBULATORY_CARE_PROVIDER_SITE_OTHER): Payer: Commercial Managed Care - HMO | Admitting: Family Medicine

## 2015-09-15 ENCOUNTER — Encounter: Payer: Self-pay | Admitting: Family Medicine

## 2015-09-15 VITALS — BP 150/100 | HR 82 | Temp 98.3°F | Ht 74.5 in | Wt 338.5 lb

## 2015-09-15 DIAGNOSIS — Z0000 Encounter for general adult medical examination without abnormal findings: Secondary | ICD-10-CM | POA: Diagnosis not present

## 2015-09-15 DIAGNOSIS — Z01818 Encounter for other preprocedural examination: Secondary | ICD-10-CM

## 2015-09-15 DIAGNOSIS — I10 Essential (primary) hypertension: Secondary | ICD-10-CM

## 2015-09-15 DIAGNOSIS — R9431 Abnormal electrocardiogram [ECG] [EKG]: Secondary | ICD-10-CM

## 2015-09-15 DIAGNOSIS — Z6841 Body Mass Index (BMI) 40.0 and over, adult: Secondary | ICD-10-CM

## 2015-09-15 DIAGNOSIS — Z1211 Encounter for screening for malignant neoplasm of colon: Secondary | ICD-10-CM

## 2015-09-15 NOTE — Assessment & Plan Note (Signed)
Elevated but pt missed dose this morning. Advised monitor bp at home and update me if persistently >140/90.

## 2015-09-15 NOTE — Assessment & Plan Note (Signed)
Preventative protocols reviewed and updated unless pt declined. Discussed healthy diet and lifestyle.  

## 2015-09-15 NOTE — Patient Instructions (Addendum)
Pass by lab for stool kit.  We will order echocardiogram for abnormal EKG. Monitor blood pressure at home and let me know if consistently >140/90.  Return as needed or in 6 months for blood pressure follow up.   Health Maintenance, Male A healthy lifestyle and preventative care can promote health and wellness.  Maintain regular health, dental, and eye exams.  Eat a healthy diet. Foods like vegetables, fruits, whole grains, low-fat dairy products, and lean protein foods contain the nutrients you need and are low in calories. Decrease your intake of foods high in solid fats, added sugars, and salt. Get information about a proper diet from your health care provider, if necessary.  Regular physical exercise is one of the most important things you can do for your health. Most adults should get at least 150 minutes of moderate-intensity exercise (any activity that increases your heart rate and causes you to sweat) each week. In addition, most adults need muscle-strengthening exercises on 2 or more days a week.   Maintain a healthy weight. The body mass index (BMI) is a screening tool to identify possible weight problems. It provides an estimate of body fat based on height and weight. Your health care provider can find your BMI and can help you achieve or maintain a healthy weight. For males 20 years and older:  A BMI below 18.5 is considered underweight.  A BMI of 18.5 to 24.9 is normal.  A BMI of 25 to 29.9 is considered overweight.  A BMI of 30 and above is considered obese.  Maintain normal blood lipids and cholesterol by exercising and minimizing your intake of saturated fat. Eat a balanced diet with plenty of fruits and vegetables. Blood tests for lipids and cholesterol should begin at age 6 and be repeated every 5 years. If your lipid or cholesterol levels are high, you are over age 79, or you are at high risk for heart disease, you may need your cholesterol levels checked more  frequently.Ongoing high lipid and cholesterol levels should be treated with medicines if diet and exercise are not working.  If you smoke, find out from your health care provider how to quit. If you do not use tobacco, do not start.  Lung cancer screening is recommended for adults aged 51-80 years who are at high risk for developing lung cancer because of a history of smoking. A yearly low-dose CT scan of the lungs is recommended for people who have at least a 30-pack-year history of smoking and are current smokers or have quit within the past 15 years. A pack year of smoking is smoking an average of 1 pack of cigarettes a day for 1 year (for example, a 30-pack-year history of smoking could mean smoking 1 pack a day for 30 years or 2 packs a day for 15 years). Yearly screening should continue until the smoker has stopped smoking for at least 15 years. Yearly screening should be stopped for people who develop a health problem that would prevent them from having lung cancer treatment.  If you choose to drink alcohol, do not have more than 2 drinks per day. One drink is considered to be 12 oz (360 mL) of beer, 5 oz (150 mL) of wine, or 1.5 oz (45 mL) of liquor.  Avoid the use of street drugs. Do not share needles with anyone. Ask for help if you need support or instructions about stopping the use of drugs.  High blood pressure causes heart disease and increases the risk  of stroke. High blood pressure is more likely to develop in:  People who have blood pressure in the end of the normal range (100-139/85-89 mm Hg).  People who are overweight or obese.  People who are African American.  If you are 30-53 years of age, have your blood pressure checked every 3-5 years. If you are 14 years of age or older, have your blood pressure checked every year. You should have your blood pressure measured twice--once when you are at a hospital or clinic, and once when you are not at a hospital or clinic. Record the  average of the two measurements. To check your blood pressure when you are not at a hospital or clinic, you can use:  An automated blood pressure machine at a pharmacy.  A home blood pressure monitor.  If you are 16-3 years old, ask your health care provider if you should take aspirin to prevent heart disease.  Diabetes screening involves taking a blood sample to check your fasting blood sugar level. This should be done once every 3 years after age 80 if you are at a normal weight and without risk factors for diabetes. Testing should be considered at a younger age or be carried out more frequently if you are overweight and have at least 1 risk factor for diabetes.  Colorectal cancer can be detected and often prevented. Most routine colorectal cancer screening begins at the age of 77 and continues through age 69. However, your health care provider may recommend screening at an earlier age if you have risk factors for colon cancer. On a yearly basis, your health care provider may provide home test kits to check for hidden blood in the stool. A small camera at the end of a tube may be used to directly examine the colon (sigmoidoscopy or colonoscopy) to detect the earliest forms of colorectal cancer. Talk to your health care provider about this at age 49 when routine screening begins. A direct exam of the colon should be repeated every 5-10 years through age 16, unless early forms of precancerous polyps or small growths are found.  People who are at an increased risk for hepatitis B should be screened for this virus. You are considered at high risk for hepatitis B if:  You were born in a country where hepatitis B occurs often. Talk with your health care provider about which countries are considered high risk.  Your parents were born in a high-risk country and you have not received a shot to protect against hepatitis B (hepatitis B vaccine).  You have HIV or AIDS.  You use needles to inject street  drugs.  You live with, or have sex with, someone who has hepatitis B.  You are a man who has sex with other men (MSM).  You get hemodialysis treatment.  You take certain medicines for conditions like cancer, organ transplantation, and autoimmune conditions.  Hepatitis C blood testing is recommended for all people born from [birth year] through 1966 and any individual with known risk factors for hepatitis C.  Healthy men should no longer receive prostate-specific antigen (PSA) blood tests as part of routine cancer screening. Talk to your health care provider about prostate cancer screening.  Testicular cancer screening is not recommended for adolescents or adult males who have no symptoms. Screening includes self-exam, a health care provider exam, and other screening tests. Consult with your health care provider about any symptoms you have or any concerns you have about testicular cancer.  Practice safe sex.  Use condoms and avoid high-risk sexual practices to reduce the spread of sexually transmitted infections (STIs).  You should be screened for STIs, including gonorrhea and chlamydia if:  You are sexually active and are younger than 24 years.  You are older than 24 years, and your health care provider tells you that you are at risk for this type of infection.  Your sexual activity has changed since you were last screened, and you are at an increased risk for chlamydia or gonorrhea. Ask your health care provider if you are at risk.  If you are at risk of being infected with HIV, it is recommended that you take a prescription medicine daily to prevent HIV infection. This is called pre-exposure prophylaxis (PrEP). You are considered at risk if:  You are a man who has sex with other men (MSM).  You are a heterosexual man who is sexually active with multiple partners.  You take drugs by injection.  You are sexually active with a partner who has HIV.  Talk with your health care provider about  whether you are at high risk of being infected with HIV. If you choose to begin PrEP, you should first be tested for HIV. You should then be tested every 3 months for as long as you are taking PrEP.  Use sunscreen. Apply sunscreen liberally and repeatedly throughout the day. You should seek shade when your shadow is shorter than you. Protect yourself by wearing long sleeves, pants, a wide-brimmed hat, and sunglasses year round whenever you are outdoors.  Tell your health care provider of new moles or changes in moles, especially if there is a change in shape or color. Also, tell your health care provider if a mole is larger than the size of a pencil eraser.  A one-time screening for abdominal aortic aneurysm (AAA) and surgical repair of large AAAs by ultrasound is recommended for men aged 72-75 years who are current or former smokers.  Stay current with your vaccines (immunizations).   This information is not intended to replace advice given to you by your health care provider. Make sure you discuss any questions you have with your health care provider.   Document Released: 01/07/2008 Document Revised: 08/01/2014 Document Reviewed: 12/06/2010 Elsevier Interactive Patient Education [employer].

## 2015-09-15 NOTE — Assessment & Plan Note (Signed)
Discussed healthy diet and lifestyle changes to affect sustainable weight loss  
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
## 2015-09-15 NOTE — Progress Notes (Signed)
Pre visit review using our clinic review tool, if applicable. No additional management support is needed unless otherwise documented below in the visit note. 

## 2015-09-15 NOTE — Assessment & Plan Note (Addendum)
Discussed risks of surgery - rec start aerobic exercise regimen for goal weight loss.  EKG with ?q waves III and V1. Denies significant OSA sxs. Will order echocardiogram prior to upcoming surgery - to eval for wall motion abnormalities or evidence of increased pulm pressures. If normal, adequately low risk to proceed with orthopedic surgery.  EKG - NSR rate 70s, normal axis and intervals, no acute ST/T changes. possible q's in III and V1, no old to compare

## 2015-09-23 ENCOUNTER — Other Ambulatory Visit: Payer: Commercial Managed Care - HMO

## 2015-09-23 DIAGNOSIS — Z1211 Encounter for screening for malignant neoplasm of colon: Secondary | ICD-10-CM

## 2015-09-23 HISTORY — PX: ESOPHAGOGASTRODUODENOSCOPY: SHX1529

## 2015-09-23 HISTORY — PX: COLONOSCOPY: SHX174

## 2015-09-23 LAB — FECAL OCCULT BLOOD, IMMUNOCHEMICAL: Fecal Occult Bld: POSITIVE — AB

## 2015-09-26 ENCOUNTER — Other Ambulatory Visit: Payer: Self-pay | Admitting: Family Medicine

## 2015-09-26 DIAGNOSIS — R195 Other fecal abnormalities: Secondary | ICD-10-CM

## 2015-09-26 DIAGNOSIS — D649 Anemia, unspecified: Secondary | ICD-10-CM

## 2015-10-01 ENCOUNTER — Ambulatory Visit (INDEPENDENT_AMBULATORY_CARE_PROVIDER_SITE_OTHER): Payer: Commercial Managed Care - HMO | Admitting: Physician Assistant

## 2015-10-01 ENCOUNTER — Encounter: Payer: Self-pay | Admitting: Physician Assistant

## 2015-10-01 ENCOUNTER — Other Ambulatory Visit (INDEPENDENT_AMBULATORY_CARE_PROVIDER_SITE_OTHER): Payer: Commercial Managed Care - HMO

## 2015-10-01 VITALS — BP 138/88 | HR 82 | Ht 74.5 in | Wt 338.0 lb

## 2015-10-01 DIAGNOSIS — R195 Other fecal abnormalities: Secondary | ICD-10-CM

## 2015-10-01 DIAGNOSIS — D649 Anemia, unspecified: Secondary | ICD-10-CM

## 2015-10-01 DIAGNOSIS — K219 Gastro-esophageal reflux disease without esophagitis: Secondary | ICD-10-CM

## 2015-10-01 LAB — CBC WITH DIFFERENTIAL/PLATELET
BASOS ABS: 0 10*3/uL (ref 0.0–0.1)
BASOS PCT: 0.5 % (ref 0.0–3.0)
EOS ABS: 0.1 10*3/uL (ref 0.0–0.7)
Eosinophils Relative: 0.8 % (ref 0.0–5.0)
HCT: 41.3 % (ref 39.0–52.0)
HEMOGLOBIN: 13.6 g/dL (ref 13.0–17.0)
Lymphocytes Relative: 22.6 % (ref 12.0–46.0)
Lymphs Abs: 2.3 10*3/uL (ref 0.7–4.0)
MCHC: 33 g/dL (ref 30.0–36.0)
MCV: 87.1 fl (ref 78.0–100.0)
MONO ABS: 0.7 10*3/uL (ref 0.1–1.0)
Monocytes Relative: 6.7 % (ref 3.0–12.0)
Neutro Abs: 7 10*3/uL (ref 1.4–7.7)
Neutrophils Relative %: 69.4 % (ref 43.0–77.0)
Platelets: 256 10*3/uL (ref 150.0–400.0)
RBC: 4.74 Mil/uL (ref 4.22–5.81)
RDW: 13.1 % (ref 11.5–15.5)
WBC: 10.1 10*3/uL (ref 4.0–10.5)

## 2015-10-01 MED ORDER — RANITIDINE HCL 75 MG PO TABS: 75.0000 mg | ORAL_TABLET | Freq: Two times a day (BID) | ORAL | Status: DC

## 2015-10-01 NOTE — Patient Instructions (Signed)
Please go to the basement level to have your labs drawn.  We sent a prescription for Zantac 75 mg, take 1 tab at bedtime. You could also try over the counter Pepcid 20 mg at bedtime.  You have been scheduled for an endoscopy and colonoscopy. Please follow the written instructions given to you at your visit today. Please pick up your prep supplies at the pharmacy within the next 1-3 days. [facility] [place] [place].  If you use inhalers (even only as needed), please bring them with you on the day of your procedure. Your physician has requested that you go to www.startemmi.com and enter the access code given to you at your visit today. This web site gives a general overview about your procedure. However, you should still follow specific instructions given to you by our office regarding your preparation for the procedure.

## 2015-10-01 NOTE — Progress Notes (Signed)
Patient ID: Jonathan Compton, male   DOB: Dec 10, 1970, 45 y.o.   MRN: [id number]   Subjective:    Patient ID: Jonathan Compton, male    DOB: Dec 10, 1970, 45 y.o.   MRN: [id number]  HPI  Jonathan Compton   is a pleasant 45 year old African-American male referred today by Dr. Sharen Compton for evaluation of normocytic anemia and heme positive stool. Patient has been undergoing recent medical evaluation prior to anticipated right shoulder surgery. He had labs done on 09/14/2015 which showed hemoglobin of 12.4 hematocrit of 38.4 MCV of 87. Subsequent Hemoccults were done and these were positive. He also has an echo scheduled and says that his shoulder surgery is not scheduled for specific date as yet. He has not had any prior GI issues and no prior GI evaluation. He says infrequently he will see a small amount of bright red blood on the tissue which she has attributed to a hemorrhoid. He has no complaints of abdominal pain or rectal pain. He does have frequent heartburn and says he keeps antacids in his nightstand and uses the most nights. Over the past 6-8 months these symptoms have been somewhat worse except frequently with symptoms. He denies any dysphagia or odynophagia. Appetite has been good weight has been stable he has not noticed any changes in his bowel habits or melena. He says he had been taking a fair amount of NSAIDs over the past few months says he had been feeling somewhat fatigued. He was also taking vitamins testosterone supplements etc. and says he stopped everything 2 weeks ago when he learned about his lab work. Family history negative for colon cancer and polyps as far as he is aware.  Review of Systems Pertinent positive and negative review of systems were noted in the above HPI section.  All other review of systems was otherwise negative.  Outpatient Encounter Prescriptions as of 10/01/2015  Medication Sig  . traMADol (ULTRAM) 50 MG tablet Take 50 mg by mouth every 6 (six) hours as needed. for pain    . ranitidine (ZANTAC 75) 75 MG tablet Take 1 tablet (75 mg total) by mouth 2 (two) times daily.  . [DISCONTINUED] Olmesartan-Amlodipine-HCTZ 40-5-25 MG TABS Take 1 tablet by mouth daily.   No facility-administered encounter medications on file as of 10/01/2015.   No Known Allergies Patient Active Problem List   Diagnosis Date Noted  . Preoperative clearance 09/11/2015  . GERD (gastroesophageal reflux disease) 07/22/2014  . Healthcare maintenance 07/01/2013  . Hypertension   . Morbid obesity with BMI of 40.0-44.9, adult [facility](HCC)    Social History   Social History  . Marital Status: Married    Spouse Name: N/A  . Number of Children: N/A  . Years of Education: N/A   Occupational History  . Not on file.   Social History Main Topics  . Smoking status: Former Smoker    Quit date: 07/25/2005  . Smokeless tobacco: Never Used  . Alcohol Use: No     Comment: quit 2007  . Drug Use: No     Comment: MJ then crack cocaine  . Sexual Activity: Not on file   Other Topics Concern  . Not on file   Social History Narrative   Lives with wife, daughter ([id number]) and mother   Occupation: crew member for [employer]   Edu: HS   Activity: some treadmill use   Diet: good water, fruits/vegetables some, has nutri-bullet    [name]'s family history includes Alcohol abuse in his paternal uncle; Aneurysm in  his mother; Diabetes in his maternal grandmother and mother; Heart failure in his maternal aunt; Hypertension in his mother; Kidney failure in his paternal grandmother; Other in his father. There is no history of Stroke or CAD.      Objective:    Filed Vitals:   10/01/15 0948  BP: 138/88  Pulse: 82    Physical Exam  well-developed African-American male in no acute distress, very pleasant blood pressure 138/88 pulse 82 height 6 foot 2 weight 338, BMI 42.8. HEENT ;nontraumatic, cephalic EOMI PERRLA sclera anicteric, Cardiovascular; regular rate and rhythm with S1-S2 no murmur or gallop, Pulmonary  ;clear bilaterally, Abdomen ;obese soft nontender nondistended bowel sounds are active no palpable mass or hepatosplenomegaly Bowel sounds are present, Rectal ;exam not done today recently documented Hemoccult positive, Extremities; no clubbing cyanosis or edema skin warm dry, Neuropsych ;mood and affect appropriate     Assessment & Plan:   #1 45 yo  AA male with mild normocytic anemia and heme  + stool.Etiology not clear rule out occult colon lesion versus upper GI pathology, gastropathy ,ulcer disease or occult lesion #2 chronic GERD-rule out Barrett's #3 morbid obesity #4 hypertension #5 right shoulder injury anticipating upcoming surgery  Plan; Patient will be scheduled for colonoscopy and EGD with Dr. Myrtie Neither. Both procedures discussed in detail with patient and he is agreeable to proceed. Start anti-reflux regimen Start Zantac 75 mg or Pepcid 20 mg by mouth daily at bedtime on a regular basis Check follow-up CBC today to ensure stability  Jonathan Cooper PA-C 10/01/2015   Cc: Jonathan Boyden, MD

## 2015-10-02 ENCOUNTER — Other Ambulatory Visit: Payer: Self-pay

## 2015-10-02 ENCOUNTER — Ambulatory Visit (HOSPITAL_COMMUNITY): Payer: Commercial Managed Care - HMO | Attending: Cardiology

## 2015-10-02 DIAGNOSIS — Z01818 Encounter for other preprocedural examination: Secondary | ICD-10-CM

## 2015-10-02 DIAGNOSIS — R9431 Abnormal electrocardiogram [ECG] [EKG]: Secondary | ICD-10-CM

## 2015-10-02 DIAGNOSIS — I77810 Thoracic aortic ectasia: Secondary | ICD-10-CM | POA: Insufficient documentation

## 2015-10-02 DIAGNOSIS — I119 Hypertensive heart disease without heart failure: Secondary | ICD-10-CM | POA: Diagnosis not present

## 2015-10-02 DIAGNOSIS — Z6841 Body Mass Index (BMI) 40.0 and over, adult: Secondary | ICD-10-CM | POA: Diagnosis not present

## 2015-10-02 DIAGNOSIS — Z01810 Encounter for preprocedural cardiovascular examination: Secondary | ICD-10-CM | POA: Diagnosis present

## 2015-10-02 NOTE — Progress Notes (Signed)
Thank you for sending this case to me. I have reviewed the entire note, and the outlined plan seems appropriate.  

## 2015-10-04 ENCOUNTER — Other Ambulatory Visit: Payer: Self-pay | Admitting: Internal Medicine

## 2015-10-04 DIAGNOSIS — R195 Other fecal abnormalities: Secondary | ICD-10-CM

## 2015-10-04 MED ORDER — NA SULFATE-K SULFATE-MG SULF 17.5-3.13-1.6 GM/177ML PO SOLN: ORAL | Status: DC

## 2015-10-05 ENCOUNTER — Ambulatory Visit (AMBULATORY_SURGERY_CENTER): Payer: Commercial Managed Care - HMO | Admitting: Gastroenterology

## 2015-10-05 ENCOUNTER — Encounter: Payer: Self-pay | Admitting: Gastroenterology

## 2015-10-05 VITALS — BP 134/85 | HR 67 | Temp 99.6°F | Resp 16 | Ht 74.0 in | Wt 338.0 lb

## 2015-10-05 DIAGNOSIS — R195 Other fecal abnormalities: Secondary | ICD-10-CM | POA: Diagnosis present

## 2015-10-05 DIAGNOSIS — K219 Gastro-esophageal reflux disease without esophagitis: Secondary | ICD-10-CM

## 2015-10-05 MED ORDER — SODIUM CHLORIDE 0.9 % IV SOLN: 500.0000 mL | INTRAVENOUS | Status: DC

## 2015-10-05 NOTE — Op Note (Signed)
[facility] Patient Name: Jonathan Compton Procedure Date: 10/05/2015 2:34 PM MRN: [id number] Endoscopist: Jonathan Compton , MD Age: 45 Referring MD:  Date of Birth: July 06, 1971 Gender: Male Procedure:                Colonoscopy Indications:              Heme positive stool Medicines:                Monitored Anesthesia Care Procedure:                Pre-Anesthesia Assessment:                           - Prior to the procedure, a History and Physical                            was performed, and patient medications and                            allergies were reviewed. The patient's tolerance of                            previous anesthesia was also reviewed. The risks                            and benefits of the procedure and the sedation                            options and risks were discussed with the patient.                            All questions were answered, and informed consent                            was obtained. Prior Anticoagulants: The patient has                            taken no previous anticoagulant or antiplatelet                            agents. ASA Grade Assessment: III - A patient with                            severe systemic disease. After reviewing the risks                            and benefits, the patient was deemed in                            satisfactory condition to undergo the procedure.                           After obtaining informed consent, the colonoscope  was passed under direct vision. Throughout the                            procedure, the patient's blood pressure, pulse, and                            oxygen saturations were monitored continuously. The                            Model CF-HQ190L [id number]) scope was introduced                            through the anus and advanced to the the cecum,                            identified by appendiceal orifice and ileocecal                valve. The colonoscopy was performed without                            difficulty. The patient tolerated the procedure                            well. The quality of the bowel preparation was                            good. The ileocecal valve, appendiceal orifice, and                            rectum were photographed. The quality of the bowel                            preparation was evaluated using the BBPS [facility] Iv                            Bowel Preparation Scale) with scores of: Right                            Colon = 2, Transverse Colon = 2 and Left Colon = 2.                            The total BBPS score equals 6. The bowel                            preparation used was Miralax. Scope In: 2:52:33 PM Scope Out: 3:03:35 PM Scope Withdrawal Time: 0 hours 6 minutes 46 seconds  Total Procedure Duration: 0 hours 11 minutes 2 seconds  Findings:      The perianal and digital rectal examinations were normal.      The entire examined colon appeared normal on direct and retroflexion       views. Complications:            No immediate complications. Estimated Blood Loss:     Estimated blood loss: none. Impression:               -  The entire examined colon is normal on direct and                            retroflexion views.                           - No specimens collected.                           - The heme positive stool is most likely from                            chronic NSAID use and is unrelated to the mild,                            normocytic anemia. Recommendation:           - Patient has a contact number available for                            emergencies. The signs and symptoms of potential                            delayed complications were discussed with the                            patient. Return to normal activities tomorrow.                            Written discharge instructions were provided to the                            patient.                            - Resume previous diet.                           - Continue present medications.                           - Repeat colonoscopy in 10 years for screening                            purposes. Procedure Code(s):        --- Professional ---                           [id number], Colonoscopy, flexible; diagnostic, including                            collection of specimen(s) by brushing or washing,                            when performed (separate procedure) CPT copyright 2016 American Medical Association. All rights reserved. Jonathan Jonathan L. Myrtie Neitheranis, MD Jonathan LakeHenry L. Myrtie Neitheranis, MD 10/05/2015 3:13:31 PM  Number of Addenda: 0 

## 2015-10-05 NOTE — Progress Notes (Signed)
A/ox3 pleased with MAC, report to Celia RN 

## 2015-10-05 NOTE — Op Note (Signed)
[facility] Patient Name: Jonathan Compton Procedure Date: 10/05/2015 2:35 PM MRN: [id number] Endoscopist: Sherilyn Cooter L. Myrtie Neither , MD Age: 45 Referring MD:  Date of Birth: 1971/03/27 Gender: Male Procedure:                Upper GI endoscopy Indications:              Heme positive stool Medicines:                Monitored Anesthesia Care Procedure:                Pre-Anesthesia Assessment:                           - Prior to the procedure, a History and Physical                            was performed, and patient medications and                            allergies were reviewed. The patient's tolerance of                            previous anesthesia was also reviewed. The risks                            and benefits of the procedure and the sedation                            options and risks were discussed with the patient.                            All questions were answered, and informed consent                            was obtained. Prior Anticoagulants: The patient has                            taken no previous anticoagulant or antiplatelet                            agents. ASA Grade Assessment: III - A patient with                            severe systemic disease. After reviewing the risks                            and benefits, the patient was deemed in                            satisfactory condition to undergo the procedure.                           After obtaining informed consent, the endoscope was  passed under direct vision. Throughout the                            procedure, the patient's blood pressure, pulse, and                            oxygen saturations were monitored continuously. The                            Model GIF-HQ190 [id number]) scope was introduced                            through the mouth, and advanced to the second part                            of duodenum. The upper GI endoscopy was                accomplished without difficulty. The patient                            tolerated the procedure well. Scope In: Scope Out: Findings:      The esophagus was normal.      The stomach was normal, including on retroflexion.      The examined duodenum was normal. Complications:            No immediate complications. Estimated Blood Loss:     Estimated blood loss: none. Impression:               - Normal esophagus.                           - Normal stomach.                           - Normal examined duodenum.                           - No specimens collected. Recommendation:           - Patient has a contact number available for                            emergencies. The signs and symptoms of potential                            delayed complications were discussed with the                            patient. Return to normal activities tomorrow.                            Written discharge instructions were provided to the                            patient.                           -  Resume previous diet.                           - Continue present medications.                           - No routine repeat upper endoscopy. Procedure Code(s):        --- Professional ---                           [id number], Esophagogastroduodenoscopy, flexible,                            transoral; diagnostic, including collection of                            specimen(s) by brushing or washing, when performed                            (separate procedure) CPT copyright 2016 American Medical Association. All rights reserved. Henry L. Myrtie Neitheranis, MD [name], MD 10/05/2015 3:09:55 PM Number of Addenda: 0

## 2015-10-05 NOTE — Patient Instructions (Signed)
Discharge instructions given. Normal exam. Resume previous medications. YOU HAD AN ENDOSCOPIC PROCEDURE TODAY AT THE [facility]:   Refer to the procedure report that was given to you for any specific questions about what was found during the examination.  If the procedure report does not answer your questions, please call your gastroenterologist to clarify.  If you requested that your care partner not be given the details of your procedure findings, then the procedure report has been included in a sealed envelope for you to review at your convenience later.  YOU SHOULD EXPECT: Some feelings of bloating in the abdomen. Passage of more gas than usual.  Walking can help get rid of the air that was put into your GI tract during the procedure and reduce the bloating. If you had a lower endoscopy (such as a colonoscopy or flexible sigmoidoscopy) you may notice spotting of blood in your stool or on the toilet paper. If you underwent a bowel prep for your procedure, you may not have a normal bowel movement for a few days.  Please Note:  You might notice some irritation and congestion in your nose or some drainage.  This is from the oxygen used during your procedure.  There is no need for concern and it should clear up in a day or so.  SYMPTOMS TO REPORT IMMEDIATELY:   Following lower endoscopy (colonoscopy or flexible sigmoidoscopy):  Excessive amounts of blood in the stool  Significant tenderness or worsening of abdominal pains  Swelling of the abdomen that is new, acute  Fever of 100F or higher   Following upper endoscopy (EGD)  Vomiting of blood or coffee ground material  New chest pain or pain under the shoulder blades  Painful or persistently difficult swallowing  New shortness of breath  Fever of 100F or higher  Black, tarry-looking stools  For urgent or emergent issues, a gastroenterologist can be reached at any hour by calling [phone].   DIET: Your first meal  following the procedure should be a small meal and then it is ok to progress to your normal diet. Heavy or fried foods are harder to digest and may make you feel nauseous or bloated.  Likewise, meals heavy in dairy and vegetables can increase bloating.  Drink plenty of fluids but you should avoid alcoholic beverages for 24 hours.  ACTIVITY:  You should plan to take it easy for the rest of today and you should NOT DRIVE or use heavy machinery until tomorrow (because of the sedation medicines used during the test).    FOLLOW UP: Our staff will call the number listed on your records the next business day following your procedure to check on you and address any questions or concerns that you may have regarding the information given to you following your procedure. If we do not reach you, we will leave a message.  However, if you are feeling well and you are not experiencing any problems, there is no need to return our call.  We will assume that you have returned to your regular daily activities without incident.  If any biopsies were taken you will be contacted by phone or by letter within the next 1-3 weeks.  Please call us at [phone] if you have not heard about the biopsies in 3 weeks.    SIGNATURES/CONFIDENTIALITY: You and/or your care partner have signed paperwork which will be entered into your electronic medical record.  These signatures attest to the fact that that the information above   on your After Visit Summary has been reviewed and is understood.  Full responsibility of the confidentiality of this discharge information lies with you and/or your care-partner. 

## 2015-10-06 ENCOUNTER — Telehealth: Payer: Self-pay

## 2015-10-06 NOTE — Telephone Encounter (Signed)
Left message on answering machine. 

## 2015-10-07 ENCOUNTER — Encounter: Payer: Self-pay | Admitting: Family Medicine

## 2015-10-07 ENCOUNTER — Telehealth: Payer: Self-pay

## 2015-10-07 NOTE — Telephone Encounter (Signed)
See result note. Printed records to send to ortho and in [facility].

## 2015-10-07 NOTE — Telephone Encounter (Signed)
Records faxed as directed.

## 2015-10-07 NOTE — Telephone Encounter (Signed)
Stephanie AcreCharles Violet [phone]  Leonette MostCharles would like to get the results of his echo from Friday so he can contact Ortho surgeon and get rotor cuff surgery schedule.

## 2015-10-24 HISTORY — PX: SHOULDER ARTHROSCOPY WITH ROTATOR CUFF REPAIR: SHX5685

## 2015-10-26 ENCOUNTER — Telehealth: Payer: Self-pay

## 2015-10-26 NOTE — Telephone Encounter (Signed)
Pt left v/m; pt was seen 09/15/15 for annual exam; pt request coupon for tribenzor to help pay for med; not on current med list but on hx med list; last refilled # 30 x 3 on 12/02/13. Please advise. [facility] [place].

## 2015-10-27 MED ORDER — OLMESARTAN-AMLODIPINE-HCTZ 40-5-25 MG PO TABS: 1.0000 | ORAL_TABLET | Freq: Every day | ORAL | Status: DC

## 2015-10-27 NOTE — Telephone Encounter (Signed)
Patient notified. He is going to check online and see if he can find a coupon. Otherwise he will need a substitute med. He can't afford $50. He will call me and let me know.

## 2015-10-27 NOTE — Telephone Encounter (Signed)
plz notify med refilled however we dont have coupon available.  Is med still affordable?

## 2015-10-27 NOTE — Telephone Encounter (Signed)
Do we have coupon for this?

## 2015-10-27 NOTE — Telephone Encounter (Signed)
I couldn't locate a coupon.

## 2016-02-25 ENCOUNTER — Telehealth: Payer: Self-pay | Admitting: Family Medicine

## 2016-02-25 NOTE — Telephone Encounter (Signed)
Spoke with Mr. Wachtler

## 2016-02-25 NOTE — Telephone Encounter (Signed)
PT RETURNED YOUR CALL PLEASE CALL [phone] THANK YOU

## 2016-03-07 ENCOUNTER — Encounter: Payer: Self-pay | Admitting: Family Medicine

## 2016-03-14 ENCOUNTER — Ambulatory Visit (INDEPENDENT_AMBULATORY_CARE_PROVIDER_SITE_OTHER): Payer: Commercial Managed Care - HMO | Admitting: Family Medicine

## 2016-03-14 ENCOUNTER — Encounter: Payer: Self-pay | Admitting: Family Medicine

## 2016-03-14 VITALS — BP 124/78 | HR 82 | Temp 98.0°F | Wt 335.5 lb

## 2016-03-14 DIAGNOSIS — R21 Rash and other nonspecific skin eruption: Secondary | ICD-10-CM | POA: Insufficient documentation

## 2016-03-14 DIAGNOSIS — I10 Essential (primary) hypertension: Secondary | ICD-10-CM | POA: Diagnosis not present

## 2016-03-14 DIAGNOSIS — Z6841 Body Mass Index (BMI) 40.0 and over, adult: Secondary | ICD-10-CM

## 2016-03-14 NOTE — Progress Notes (Signed)
   BP 124/78   Pulse 82   Temp 98 F (36.7 C) (Oral)   Wt (!) 335 lb 8 oz (152.2 kg)   BMI 43.08 kg/m    CC: 6 mo f/u visit Subjective:    Patient ID: Jonathan Compton, male    DOB: 1971-05-11, 45 y.o.   MRN: [id number]  HPI: Jonathan HittCharles A Zaborowski is a 45 y.o. male presenting on 03/14/2016 for Follow-up (6 months for blood pressure)   HTN - Compliant with current antihypertensive regimen of olmesartan-amlodipine-hctz 40/5/25 daily. Does not check blood pressures at home. No low blood pressure readings or symptoms of dizziness/syncope. Denies HA, vision changes, CP/tightness, SOB, leg swelling.   Trying to walk more, eat healthier - less biscuit-ville, more raisin bran for breakfast.   Recent shoulder surgery - released by Dr Thurston HoleWainer  Also would like rash under L arm evaluated - present for last few months, itchy. No new deodorant - uses degree.   Relevant past medical, surgical, family and social history reviewed and updated as indicated. Interim medical history since our last visit reviewed. Allergies and medications reviewed and updated. Current Outpatient Prescriptions on File Prior to Visit  Medication Sig  . Olmesartan-Amlodipine-HCTZ 40-5-25 MG TABS Take 1 tablet by mouth daily.  . ranitidine (ZANTAC 75) 75 MG tablet Take 1 tablet (75 mg total) by mouth 2 (two) times daily.   No current facility-administered medications on file prior to visit.     Review of Systems Per HPI unless specifically indicated in ROS section     Objective:    BP 124/78   Pulse 82   Temp 98 F (36.7 C) (Oral)   Wt (!) 335 lb 8 oz (152.2 kg)   BMI 43.08 kg/m   Wt Readings from Last 3 Encounters:  03/14/16 (!) 335 lb 8 oz (152.2 kg)  10/05/15 (!) 338 lb (153.3 kg)  10/01/15 (!) 338 lb (153.3 kg)    Physical Exam  Constitutional: He appears well-developed and well-nourished. No distress.  HENT:  Mouth/Throat: Oropharynx is clear and moist. No oropharyngeal exudate.  Neck: No thyromegaly  present.  Cardiovascular: Normal rate, regular rhythm, normal heart sounds and intact distal pulses.   No murmur heard. Pulmonary/Chest: Effort normal and breath sounds normal. No respiratory distress. He has no wheezes. He has no rales.  Musculoskeletal: He exhibits no edema.  Skin: Skin is warm and dry. Rash noted. No erythema.  Hyperpigmented papular rash L axilla  Multiple skin tags  Psychiatric: He has a normal mood and affect.  Nursing note and vitals reviewed.  Lab Results  Component Value Date   CREATININE 0.91 09/11/2015      Assessment & Plan:   Problem List Items Addressed This Visit    Hypertension    Chronic, improved control. Continue tri-ben-zor daily. Discussed healthy diet and lifestyle changes to keep good blood pressure control.      Morbid obesity with BMI of 40.0-44.9, adult (HCC) - Primary   Skin rash    Isolated to L axillary region - suggested change to arm/hammer deodorant and cool compresses. Update with effect. Not consistent with bacterial or fungal infection.        Other Visit Diagnoses   None.      Follow up plan: Return in about 6 months (around 09/14/2016) for annual exam, prior fasting for blood work.  Eustaquio BoydenJavier Jonah Nestle, MD

## 2016-03-14 NOTE — Patient Instructions (Signed)
You are doing well today. Return as needed or in 6-8 months for physical.

## 2016-03-14 NOTE — Progress Notes (Signed)
Pre visit review using our clinic review tool, if applicable. No additional management support is needed unless otherwise documented below in the visit note. 

## 2016-03-14 NOTE — Assessment & Plan Note (Signed)
Isolated to L axillary region - suggested change to arm/hammer deodorant and cool compresses. Update with effect. Not consistent with bacterial or fungal infection.

## 2016-03-14 NOTE — Assessment & Plan Note (Signed)
Chronic, improved control. Continue tri-ben-zor daily. Discussed healthy diet and lifestyle changes to keep good blood pressure control.

## 2016-09-12 ENCOUNTER — Other Ambulatory Visit: Payer: Self-pay | Admitting: Family Medicine

## 2016-09-12 ENCOUNTER — Encounter: Payer: Self-pay | Admitting: Family Medicine

## 2016-09-12 ENCOUNTER — Encounter (INDEPENDENT_AMBULATORY_CARE_PROVIDER_SITE_OTHER): Payer: Self-pay

## 2016-09-12 ENCOUNTER — Other Ambulatory Visit (INDEPENDENT_AMBULATORY_CARE_PROVIDER_SITE_OTHER): Payer: Commercial Managed Care - HMO

## 2016-09-12 DIAGNOSIS — Z6841 Body Mass Index (BMI) 40.0 and over, adult: Secondary | ICD-10-CM | POA: Diagnosis not present

## 2016-09-12 DIAGNOSIS — R7989 Other specified abnormal findings of blood chemistry: Secondary | ICD-10-CM

## 2016-09-13 LAB — LIPID PANEL
CHOLESTEROL: 166 mg/dL (ref 0–200)
HDL: 23.5 mg/dL — ABNORMAL LOW (ref >39.00)
Total CHOL/HDL Ratio: 7
Triglycerides: 408 mg/dL — ABNORMAL HIGH (ref 0.0–149.0)

## 2016-09-13 LAB — LDL CHOLESTEROL, DIRECT: Direct LDL: 72 mg/dL

## 2016-09-13 LAB — BASIC METABOLIC PANEL
BUN: 15 mg/dL (ref 6–23)
CALCIUM: 9.3 mg/dL (ref 8.4–10.5)
CO2: 29 meq/L (ref 19–32)
Chloride: 100 mEq/L (ref 96–112)
Creatinine, Ser: 0.99 mg/dL (ref 0.40–1.50)
GFR: 104.47 mL/min (ref >60.00)
GLUCOSE: 286 mg/dL — AB (ref 70–99)
Potassium: 4 mEq/L (ref 3.5–5.1)
SODIUM: 136 meq/L (ref 135–145)

## 2016-09-15 ENCOUNTER — Ambulatory Visit (INDEPENDENT_AMBULATORY_CARE_PROVIDER_SITE_OTHER): Payer: Commercial Managed Care - HMO | Admitting: Family Medicine

## 2016-09-15 ENCOUNTER — Encounter: Payer: Self-pay | Admitting: Family Medicine

## 2016-09-15 VITALS — BP 120/90 | HR 96 | Temp 98.5°F | Ht 74.5 in | Wt 330.5 lb

## 2016-09-15 DIAGNOSIS — I10 Essential (primary) hypertension: Secondary | ICD-10-CM | POA: Diagnosis not present

## 2016-09-15 DIAGNOSIS — Z0000 Encounter for general adult medical examination without abnormal findings: Secondary | ICD-10-CM | POA: Diagnosis not present

## 2016-09-15 DIAGNOSIS — IMO0002 Reserved for concepts with insufficient information to code with codable children: Secondary | ICD-10-CM

## 2016-09-15 DIAGNOSIS — R21 Rash and other nonspecific skin eruption: Secondary | ICD-10-CM

## 2016-09-15 DIAGNOSIS — E1169 Type 2 diabetes mellitus with other specified complication: Secondary | ICD-10-CM | POA: Insufficient documentation

## 2016-09-15 DIAGNOSIS — E1165 Type 2 diabetes mellitus with hyperglycemia: Secondary | ICD-10-CM

## 2016-09-15 DIAGNOSIS — E118 Type 2 diabetes mellitus with unspecified complications: Secondary | ICD-10-CM

## 2016-09-15 DIAGNOSIS — Z6841 Body Mass Index (BMI) 40.0 and over, adult: Secondary | ICD-10-CM | POA: Diagnosis not present

## 2016-09-15 HISTORY — DX: Type 2 diabetes mellitus with other specified complication: E11.69

## 2016-09-15 NOTE — Progress Notes (Signed)
BP 120/90   Pulse 96   Temp 98.5 F (36.9 C) (Oral)   Ht 6' 2.5" (1.892 m)   Wt (!) 330 lb 8 oz (149.9 kg)   BMI 41.87 kg/m    CC: CPE Subjective:    Patient ID: Jonathan Compton, male    DOB: 10-09-1970, 46 y.o.   MRN: [id number]  HPI: Jonathan Compton is a 46 y.o. male presenting on 09/15/2016 for Annual Exam   L axillary rash unchanged. No improvement with change in detergent.  Several weeks ago "touch of pneumonia" treated with 7d doxycycline course.     Preventative: COLONOSCOPY 09/2015 WNL ([facility]) ESOPHAGOGASTRODUODENOSCOPY 09/2015 WNL Flu - declines  Tdap 2014  Seat belt use discussed Sunscreen use discussed. No changing moles on skin. Ex smoker - quit 2007 Alcohol - none  Lives with wife, daughter 30(12) and mother Occupation: crew member for [employer]  Edu: HS Activity: active at work, no treadmill Diet: good water, 4 cups sweet tea, fruits/vegetables some   Relevant past medical, surgical, family and social history reviewed and updated as indicated. Interim medical history since our last visit reviewed. Allergies and medications reviewed and updated. Outpatient Medications Prior to Visit  Medication Sig Dispense Refill  . Olmesartan-Amlodipine-HCTZ 40-5-25 MG TABS Take 1 tablet by mouth daily. 30 tablet 11  . ranitidine (ZANTAC 75) 75 MG tablet Take 1 tablet (75 mg total) by mouth 2 (two) times daily. 30 tablet 3   No facility-administered medications prior to visit.      Per HPI unless specifically indicated in ROS section below Review of Systems  Constitutional: Negative for activity change, appetite change, chills, fatigue, fever and unexpected weight change.  HENT: Negative for hearing loss.   Eyes: Negative for visual disturbance.  Respiratory: Positive for cough ([facility]) and shortness of breath (mild). Negative for chest tightness and wheezing.   Cardiovascular: Negative for chest pain, palpitations and leg swelling.    Gastrointestinal: Negative for abdominal distention, abdominal pain, blood in stool, constipation, diarrhea, nausea and vomiting.       Indigestion treated with zantac  Genitourinary: Negative for difficulty urinating and hematuria.  Musculoskeletal: Negative for arthralgias, myalgias and neck pain.  Skin: Negative for rash.  Neurological: Negative for dizziness, seizures, syncope and headaches.  Hematological: Negative for adenopathy. Does not bruise/bleed easily.  Psychiatric/Behavioral: Negative for dysphoric mood. The patient is not nervous/anxious.        Objective:    BP 120/90   Pulse 96   Temp 98.5 F (36.9 C) (Oral)   Ht 6' 2.5" (1.892 m)   Wt (!) 330 lb 8 oz (149.9 kg)   BMI 41.87 kg/m   Wt Readings from Last 3 Encounters:  09/15/16 (!) 330 lb 8 oz (149.9 kg)  03/14/16 (!) 335 lb 8 oz (152.2 kg)  10/05/15 (!) 338 lb (153.3 kg)    Physical Exam  Constitutional: He is oriented to person, place, and time. He appears well-developed and well-nourished. No distress.  HENT:  Head: Normocephalic and atraumatic.  Right Ear: Hearing, tympanic membrane, external ear and ear canal normal.  Left Ear: Hearing, tympanic membrane, external ear and ear canal normal.  Nose: Nose normal.  Mouth/Throat: Uvula is midline, oropharynx is clear and moist and mucous membranes are normal. No oropharyngeal exudate, posterior oropharyngeal edema or posterior oropharyngeal erythema.  Eyes: Conjunctivae and EOM are normal. Pupils are equal, round, and reactive to light. No scleral icterus.  Neck: Normal range of  motion. Neck supple. No thyromegaly present.  Cardiovascular: Normal rate, regular rhythm, normal heart sounds and intact distal pulses.   No murmur heard. Pulses:      Radial pulses are 2+ on the right side, and 2+ on the left side.  Pulmonary/Chest: Effort normal and breath sounds normal. No respiratory distress. He has no wheezes. He has no rales.  Abdominal: Soft. Bowel sounds are  normal. He exhibits no distension and no mass. There is no tenderness. There is no rebound and no guarding.  Musculoskeletal: Normal range of motion. He exhibits no edema.  Lymphadenopathy:    He has no cervical adenopathy.  Neurological: He is alert and oriented to person, place, and time.  CN grossly intact, station and gait intact  Skin: Skin is warm and dry. Rash noted.  Pruritic hyperpigmented macular rash L axilla Several large skin tags at L axilla  Psychiatric: He has a normal mood and affect. His behavior is normal. Judgment and thought content normal.  Nursing note and vitals reviewed.  Results for orders placed or performed in visit on 09/12/16  Lipid panel  Result Value Ref Range   Cholesterol 166 0 - 200 mg/dL   Triglycerides (H) 0.0 - 149.0 mg/dL    161.0 Triglyceride is over 400; calculations on Lipids are invalid.   HDL 23.50 (L) >39.00 mg/dL   Total CHOL/HDL Ratio 7   Basic metabolic panel  Result Value Ref Range   Sodium 136 135 - 145 mEq/L   Potassium 4.0 3.5 - 5.1 mEq/L   Chloride 100 96 - 112 mEq/L   CO2 29 19 - 32 mEq/L   Glucose, Bld 286 (H) 70 - 99 mg/dL   BUN 15 6 - 23 mg/dL   Creatinine, Ser 9.60 0.40 - 1.50 mg/dL   Calcium 9.3 8.4 - 45.4 mg/dL   GFR 098.11 >91.47 mL/min  LDL cholesterol, direct  Result Value Ref Range   Direct LDL 72.0 mg/dL      Assessment & Plan:   Problem List Items Addressed This Visit    Diabetes mellitus type 2, uncontrolled, with complications (HCC)    New. Discussed pathophysiology with patient. Discussed diet and lifestyle changes necessary to help control sugar levels. Discussed implications of uncontrolled diabetes on all organs of body.  Anticipate hypertriglyceridemia related to hyperglycemia. Pt declines medications, desires 3 mo TLC prior to starting. Pt declines DSME at this time.       Healthcare maintenance - Primary    Preventative protocols reviewed and updated unless pt declined. Discussed healthy diet  and lifestyle.       Hypertension    Chronic, stable. Continue tribenzor.       Morbid obesity with BMI of 40.0-44.9, adult (HCC)    Discussed healthy diet and lifestyle changes to affect sustainable weight loss.       Skin rash    Anticipate L axillary intertrigo vs acanthosis nigricans. Given sxs of itching, start lotrimin BID x 3 wks. Update with effect.           Follow up plan: Return in about 3 months (around 12/13/2016) for follow up visit.  Eustaquio Boyden, MD

## 2016-09-15 NOTE — Assessment & Plan Note (Signed)
Discussed healthy diet and lifestyle changes to affect sustainable weight loss  

## 2016-09-15 NOTE — Assessment & Plan Note (Signed)
Chronic, stable. Continue tribenzor.

## 2016-09-15 NOTE — Assessment & Plan Note (Signed)
Preventative protocols reviewed and updated unless pt declined. Discussed healthy diet and lifestyle.  

## 2016-09-15 NOTE — Patient Instructions (Addendum)
Sugars are in diabetes range.  We need to work towards better sugar control - change diet - limit carbs, sweets, sugars, and sweetened beverages. Work on regular exercise regimen (treadmill or walking daily).  Try lotrimin antifungal cream to rash on left underarm - twice daily for 2-3 weeks. May be intertrigo, may be acanthosis nigricans.  Return in 3 months for labs and then office visit.   Diabetes Mellitus and Food It is important for you to manage your blood sugar (glucose) level. Your blood glucose level can be greatly affected by what you eat. Eating healthier foods in the appropriate amounts throughout the day at about the same time each day will help you control your blood glucose level. It can also help slow or prevent worsening of your diabetes mellitus. Healthy eating may even help you improve the level of your blood pressure and reach or maintain a healthy weight. General recommendations for healthful eating and cooking habits include:  Eating meals and snacks regularly. Avoid going long periods of time without eating to lose weight.  Eating a diet that consists mainly of plant-based foods, such as fruits, vegetables, nuts, legumes, and whole grains.  Using low-heat cooking methods, such as baking, instead of high-heat cooking methods, such as deep frying. Work with your dietitian to make sure you understand how to use the Nutrition Facts information on food labels. How can food affect me? Carbohydrates  Carbohydrates affect your blood glucose level more than any other type of food. Your dietitian will help you determine how many carbohydrates to eat at each meal and teach you how to count carbohydrates. Counting carbohydrates is important to keep your blood glucose at a healthy level, especially if you are using insulin or taking certain medicines for diabetes mellitus. Alcohol  Alcohol can cause sudden decreases in blood glucose (hypoglycemia), especially if you use insulin or take  certain medicines for diabetes mellitus. Hypoglycemia can be a life-threatening condition. Symptoms of hypoglycemia (sleepiness, dizziness, and disorientation) are similar to symptoms of having too much alcohol. If your health care provider has given you approval to drink alcohol, do so in moderation and use the following guidelines:  Women should not have more than one drink per day, and men should not have more than two drinks per day. One drink is equal to:  12 oz of beer.  5 oz of wine.  1 oz of hard liquor.  Do not drink on an empty stomach.  Keep yourself hydrated. Have water, diet soda, or unsweetened iced tea.  Regular soda, juice, and other mixers might contain a lot of carbohydrates and should be counted. What foods are not recommended? As you make food choices, it is important to remember that all foods are not the same. Some foods have fewer nutrients per serving than other foods, even though they might have the same number of calories or carbohydrates. It is difficult to get your body what it needs when you eat foods with fewer nutrients. Examples of foods that you should avoid that are high in calories and carbohydrates but low in nutrients include:  Trans fats (most processed foods list trans fats on the Nutrition Facts label).  Regular soda.  Juice.  Candy.  Sweets, such as cake, pie, doughnuts, and cookies.  Fried foods. What foods can I eat? Eat nutrient-rich foods, which will nourish your body and keep you healthy. The food you should eat also will depend on several factors, including:  The calories you need.  The medicines  you take.  Your weight.  Your blood glucose level.  Your blood pressure level.  Your cholesterol level. You should eat a variety of foods, including:  Protein.  Lean cuts of meat.  Proteins low in saturated fats, such as fish, egg whites, and beans. Avoid processed meats.  Fruits and vegetables.  Fruits and vegetables that  may help control blood glucose levels, such as apples, mangoes, and yams.  Dairy products.  Choose fat-free or low-fat dairy products, such as milk, yogurt, and cheese.  Grains, bread, pasta, and rice.  Choose whole grain products, such as multigrain bread, whole oats, and brown rice. These foods may help control blood pressure.  Fats.  Foods containing healthful fats, such as nuts, avocado, olive oil, canola oil, and fish. Does everyone with diabetes mellitus have the same meal plan? Because every person with diabetes mellitus is different, there is not one meal plan that works for everyone. It is very important that you meet with a dietitian who will help you create a meal plan that is just right for you. This information is not intended to replace advice given to you by your health care provider. Make sure you discuss any questions you have with your health care provider. Document Released: 04/07/2005 Document Revised: 12/17/2015 Document Reviewed: 06/07/2013 Elsevier Interactive Patient Education  2017 [employer].

## 2016-09-15 NOTE — Assessment & Plan Note (Signed)
Anticipate L axillary intertrigo vs acanthosis nigricans. Given sxs of itching, start lotrimin BID x 3 wks. Update with effect.

## 2016-09-15 NOTE — Progress Notes (Signed)
Pre visit review using our clinic review tool, if applicable. No additional management support is needed unless otherwise documented below in the visit note. 

## 2016-09-15 NOTE — Assessment & Plan Note (Signed)
New. Discussed pathophysiology with patient. Discussed diet and lifestyle changes necessary to help control sugar levels. Discussed implications of uncontrolled diabetes on all organs of body.  Anticipate hypertriglyceridemia related to hyperglycemia. Pt declines medications, desires 3 mo TLC prior to starting. Pt declines DSME at this time.

## 2016-09-22 ENCOUNTER — Other Ambulatory Visit: Payer: Self-pay

## 2016-09-24 ENCOUNTER — Emergency Department
Admission: EM | Admit: 2016-09-24 | Discharge: 2016-09-25 | Disposition: A | Discharge status: Home / Self Care | Payer: Commercial Managed Care - HMO | Attending: Emergency Medicine | Admitting: Emergency Medicine

## 2016-09-24 ENCOUNTER — Encounter: Payer: Self-pay | Admitting: *Deleted

## 2016-09-24 DIAGNOSIS — I10 Essential (primary) hypertension: Secondary | ICD-10-CM | POA: Diagnosis not present

## 2016-09-24 DIAGNOSIS — Z87891 Personal history of nicotine dependence: Secondary | ICD-10-CM | POA: Diagnosis not present

## 2016-09-24 DIAGNOSIS — E1165 Type 2 diabetes mellitus with hyperglycemia: Secondary | ICD-10-CM | POA: Diagnosis present

## 2016-09-24 DIAGNOSIS — E871 Hypo-osmolality and hyponatremia: Secondary | ICD-10-CM | POA: Diagnosis not present

## 2016-09-24 DIAGNOSIS — Z79899 Other long term (current) drug therapy: Secondary | ICD-10-CM | POA: Insufficient documentation

## 2016-09-24 LAB — BASIC METABOLIC PANEL
ANION GAP: 13 (ref 5–15)
BUN: 22 mg/dL — AB (ref 6–20)
CALCIUM: 9.7 mg/dL (ref 8.9–10.3)
CO2: 25 mmol/L (ref 22–32)
Chloride: 87 mmol/L — ABNORMAL LOW (ref 101–111)
Creatinine, Ser: 1.29 mg/dL — ABNORMAL HIGH (ref 0.61–1.24)
GFR calc Af Amer: >60 mL/min (ref >60)
Glucose, Bld: 782 mg/dL (ref 65–99)
POTASSIUM: 4.6 mmol/L (ref 3.5–5.1)
SODIUM: 125 mmol/L — AB (ref 135–145)

## 2016-09-24 LAB — CBC
HEMATOCRIT: 45.9 % (ref 40.0–52.0)
Hemoglobin: 14.7 g/dL (ref 13.0–18.0)
MCH: 28.2 pg (ref 26.0–34.0)
MCHC: 32.1 g/dL (ref 32.0–36.0)
MCV: 87.7 fL (ref 80.0–100.0)
PLATELETS: 206 10*3/uL (ref 150–440)
RBC: 5.23 MIL/uL (ref 4.40–5.90)
RDW: 13.1 % (ref 11.5–14.5)
WBC: 9.7 10*3/uL (ref 3.8–10.6)

## 2016-09-24 LAB — URINALYSIS, COMPLETE (UACMP) WITH MICROSCOPIC
BACTERIA UA: NONE SEEN
BILIRUBIN URINE: NEGATIVE
HGB URINE DIPSTICK: NEGATIVE
KETONES UR: 5 mg/dL — AB
LEUKOCYTES UA: NEGATIVE
NITRITE: NEGATIVE
PH: 6 (ref 5.0–8.0)
Protein, ur: NEGATIVE mg/dL
RBC / HPF: NONE SEEN RBC/hpf (ref 0–5)
SPECIFIC GRAVITY, URINE: 1.026 (ref 1.005–1.030)
Squamous Epithelial / LPF: NONE SEEN
WBC, UA: NONE SEEN WBC/hpf (ref 0–5)

## 2016-09-24 LAB — GLUCOSE, CAPILLARY
Glucose-Capillary: >600 mg/dL (ref 65–99)
Glucose-Capillary: >600 mg/dL (ref 65–99)
Glucose-Capillary: 526 mg/dL (ref 65–99)
Glucose-Capillary: >600 mg/dL (ref 65–99)

## 2016-09-24 MED ORDER — INSULIN ASPART 100 UNIT/ML ~~LOC~~ SOLN
10.0000 [IU] | Freq: Once | SUBCUTANEOUS | Status: AC
  Administered 2016-09-24: 10 [IU] via INTRAVENOUS
  Filled 2016-09-24: qty 10

## 2016-09-24 MED ORDER — PENTAFLUOROPROP-TETRAFLUOROETH EX AERO
INHALATION_SPRAY | CUTANEOUS | Status: AC
  Filled 2016-09-24: qty 30

## 2016-09-24 MED ORDER — METFORMIN HCL 500 MG PO TABS: 500.0000 mg | ORAL_TABLET | Freq: Two times a day (BID) | ORAL | 1 refills | Status: DC

## 2016-09-24 MED ORDER — SODIUM CHLORIDE 0.9 % IV BOLUS (SEPSIS)
1000.0000 mL | Freq: Once | INTRAVENOUS | Status: AC
  Administered 2016-09-24: 1000 mL via INTRAVENOUS

## 2016-09-24 MED ORDER — INSULIN ASPART 100 UNIT/ML ~~LOC~~ SOLN
SUBCUTANEOUS | Status: AC
  Filled 2016-09-24: qty 6

## 2016-09-24 MED ORDER — INSULIN ASPART 100 UNIT/ML ~~LOC~~ SOLN
5.0000 [IU] | Freq: Once | SUBCUTANEOUS | Status: AC
  Administered 2016-09-24: 5 [IU] via INTRAVENOUS
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
## 2016-09-24 NOTE — ED Triage Notes (Signed)
Pt c/o polyuria, polydypsia, and blurred vision. Pt states he saw his PCP and found that his CBG was high. Today in triage his CBG is greater than 600 per testing at this time. Pt states he wanted to try to control his blood sugar w/ diet and exercise at home which is why his PCP did not rx any meds for him for this purpose. Pt is A &O x 4, c/o decreased energy and a general "bad" feeling.

## 2016-09-25 ENCOUNTER — Telehealth: Payer: Self-pay | Admitting: Family Medicine

## 2016-09-25 LAB — GLUCOSE, CAPILLARY
GLUCOSE-CAPILLARY: 415 mg/dL — AB (ref 65–99)
GLUCOSE-CAPILLARY: 437 mg/dL — AB (ref 65–99)
GLUCOSE-CAPILLARY: 478 mg/dL — AB (ref 65–99)

## 2016-09-25 NOTE — Telephone Encounter (Signed)
Pt was seen at ER over weekend with sugars 700s. New onset diabetes. Started on metformin 500mg  bid. Would offer OV this coming week with available provider, will recommend Rx glucometer.  Keep f/u appt with me.

## 2016-09-25 NOTE — ED Provider Notes (Signed)
-----------------------------------------   2:08 AM on 09/25/2016 -----------------------------------------  Patient's blood sugars are trending down. States he feels fine and desires to be discharged home. Metformin prescription written per Dr. Derrill KayGoodman. He is to follow-up with his PCP closely. Strict return precautions given. Patient verbalizes understanding and agrees with plan of care.   Irean HongJade J Lizzett Nobile, MD 09/25/16 [phone]

## 2016-09-25 NOTE — Discharge Instructions (Signed)
1. Start metformin 500 mg twice daily. 2. Return to the ER for worsening symptoms, persistent vomiting, difficulty breathing or other concerns.

## 2016-09-25 NOTE — ED Notes (Signed)

## 2016-09-26 ENCOUNTER — Encounter: Payer: Self-pay | Admitting: *Deleted

## 2016-09-26 ENCOUNTER — Ambulatory Visit (INDEPENDENT_AMBULATORY_CARE_PROVIDER_SITE_OTHER): Payer: Commercial Managed Care - HMO | Admitting: Family Medicine

## 2016-09-26 ENCOUNTER — Encounter: Payer: Self-pay | Admitting: Family Medicine

## 2016-09-26 VITALS — BP 122/86 | HR 94 | Temp 98.2°F | Ht 74.5 in | Wt 323.8 lb

## 2016-09-26 DIAGNOSIS — E1165 Type 2 diabetes mellitus with hyperglycemia: Secondary | ICD-10-CM

## 2016-09-26 DIAGNOSIS — E118 Type 2 diabetes mellitus with unspecified complications: Secondary | ICD-10-CM

## 2016-09-26 DIAGNOSIS — I10 Essential (primary) hypertension: Secondary | ICD-10-CM | POA: Diagnosis not present

## 2016-09-26 DIAGNOSIS — Z6841 Body Mass Index (BMI) 40.0 and over, adult: Secondary | ICD-10-CM | POA: Diagnosis not present

## 2016-09-26 DIAGNOSIS — IMO0002 Reserved for concepts with insufficient information to code with codable children: Secondary | ICD-10-CM

## 2016-09-26 NOTE — Assessment & Plan Note (Signed)
bp in fair control at this time  BP Readings from Last 1 Encounters:  09/26/16 122/86   No changes needed Disc lifstyle change with low sodium diet and exercise  This has remained fairly controlled in midst of diag of new DM

## 2016-09-26 NOTE — Telephone Encounter (Signed)
Spoke with patient and appt scheduled. Patient stayed out of work today due to still not feeling well and asked for soonest available appt. Note forwarded to Dr. Milinda Antisower.
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
## 2016-09-26 NOTE — Assessment & Plan Note (Signed)
With new diag of DM2 Discussed how this problem influences overall health and the risks it imposes  Reviewed plan for weight loss with lower calorie diet (via better food choices and also portion control or program like weight watchers) and exercise building up to or more than 30 minutes 5 days per week including some aerobic activity

## 2016-09-26 NOTE — Telephone Encounter (Signed)
I will see him today as planned

## 2016-09-26 NOTE — Progress Notes (Signed)
Pre visit review using our clinic review tool, if applicable. No additional management support is needed unless otherwise documented below in the visit note. 

## 2016-09-26 NOTE — Assessment & Plan Note (Signed)
Recent diagnosis and so far with dietary indiscretion - worse than expected  Rev ED records and labs revealing a presentation with glucose over 700 but NO DKA This was corrected with IVF and insulin and he was d/c with metformin  He continued to drink sugar drinks and eat sweets  Long disc today regarding diet and referral was [facility] DM ed [facility]  He will check with ins and see what brand meter and equip he needs so I can px it  He is motivated Has a gym avail to him-plans to go tonight and begin on the exercise bike in 10 min intervals to start  So far glucose is improved greatly with metformin 500 and have no reason to believe that with motivation and lifestyle change he will be able to control this  Will plan f/u with PCP when he returns  bmet today  >25 minutes spent in face to face time with patient, >50% spent in counselling or coordination of care

## 2016-09-26 NOTE — Patient Instructions (Addendum)
Please call your insurance to find out what brand of meter/strips and lancets they cover - let me know so you can get started   Try to follow a diabetic diet - lean protein / vegetables and avoid refined carbs and sugars  No sugar drinks including juices   Stop at check out for referral to diabetic education and to schedule follow up with Dr Jonathan Compton when he returns

## 2016-09-27 ENCOUNTER — Telehealth: Payer: Self-pay

## 2016-09-27 LAB — BASIC METABOLIC PANEL
BUN: 17 mg/dL (ref 6–23)
CHLORIDE: 94 meq/L — AB (ref 96–112)
CO2: 24 mEq/L (ref 19–32)
CREATININE: 1.18 mg/dL (ref 0.40–1.50)
Calcium: 9.6 mg/dL (ref 8.4–10.5)
GFR: 85.29 mL/min (ref >60.00)
GLUCOSE: 422 mg/dL — AB (ref 70–99)
POTASSIUM: 4.3 meq/L (ref 3.5–5.1)
SODIUM: 130 meq/L — AB (ref 135–145)

## 2016-09-27 LAB — HEMOGLOBIN A1C: Hgb A1c MFr Bld: 11 % — ABNORMAL HIGH (ref 4.6–6.5)

## 2016-09-27 NOTE — Telephone Encounter (Signed)
Pt left v/m that insurance covers one touch verio; pt last seen 09/26/16.

## 2016-09-27 NOTE — Telephone Encounter (Signed)
Please send in meter and strips and lancets that go with this  One meter 100 each strips and lancets  Check glucose bid and prn for poorly controlled DM  thanks

## 2016-09-28 MED ORDER — LANCETS 30G MISC: 1 refills | Status: DC

## 2016-09-28 MED ORDER — ONETOUCH VERIO W/DEVICE KIT: 1.0000 | PACK | Freq: Two times a day (BID) | 0 refills | Status: DC | PRN

## 2016-09-28 MED ORDER — GLUCOSE BLOOD VI STRP: ORAL_STRIP | 1 refills | Status: DC

## 2016-09-28 NOTE — Telephone Encounter (Signed)
Rx's sent to pharmacy.  

## 2016-09-29 ENCOUNTER — Encounter: Payer: Self-pay | Admitting: Family Medicine

## 2016-10-01 ENCOUNTER — Emergency Department
Admission: EM | Admit: 2016-10-01 | Discharge: 2016-10-02 | Disposition: A | Discharge status: Home / Self Care | Payer: Commercial Managed Care - HMO | Attending: Student in an Organized Health Care Education/Training Program | Admitting: Student in an Organized Health Care Education/Training Program

## 2016-10-01 ENCOUNTER — Encounter: Payer: Self-pay | Admitting: Emergency Medicine

## 2016-10-01 DIAGNOSIS — Z79899 Other long term (current) drug therapy: Secondary | ICD-10-CM | POA: Diagnosis not present

## 2016-10-01 DIAGNOSIS — Z87891 Personal history of nicotine dependence: Secondary | ICD-10-CM | POA: Insufficient documentation

## 2016-10-01 DIAGNOSIS — E1165 Type 2 diabetes mellitus with hyperglycemia: Secondary | ICD-10-CM | POA: Insufficient documentation

## 2016-10-01 DIAGNOSIS — R739 Hyperglycemia, unspecified: Secondary | ICD-10-CM

## 2016-10-01 DIAGNOSIS — R5383 Other fatigue: Secondary | ICD-10-CM | POA: Diagnosis present

## 2016-10-01 DIAGNOSIS — I10 Essential (primary) hypertension: Secondary | ICD-10-CM | POA: Diagnosis not present

## 2016-10-01 HISTORY — DX: Type 2 diabetes mellitus without complications: E11.9

## 2016-10-01 LAB — BLOOD GAS, VENOUS
Acid-base deficit: 2.2 mmol/L — ABNORMAL HIGH (ref 0.0–2.0)
BICARBONATE: 23.2 mmol/L (ref 20.0–28.0)
FIO2: 0.21
O2 Saturation: 83.3 %
PATIENT TEMPERATURE: 37
PH VEN: 7.36 (ref 7.250–7.430)
pCO2, Ven: 41 mmHg — ABNORMAL LOW (ref 44.0–60.0)
pO2, Ven: 50 mmHg — ABNORMAL HIGH (ref 32.0–45.0)

## 2016-10-01 LAB — BASIC METABOLIC PANEL
Anion gap: 9 (ref 5–15)
BUN: 22 mg/dL — AB (ref 6–20)
CHLORIDE: 94 mmol/L — AB (ref 101–111)
CO2: 25 mmol/L (ref 22–32)
Calcium: 9.5 mg/dL (ref 8.9–10.3)
Creatinine, Ser: 1.23 mg/dL (ref 0.61–1.24)
GFR calc Af Amer: >60 mL/min (ref >60)
GFR calc non Af Amer: >60 mL/min (ref >60)
GLUCOSE: 509 mg/dL — AB (ref 65–99)
POTASSIUM: 4.3 mmol/L (ref 3.5–5.1)
SODIUM: 128 mmol/L — AB (ref 135–145)

## 2016-10-01 LAB — GLUCOSE, CAPILLARY
GLUCOSE-CAPILLARY: 383 mg/dL — AB (ref 65–99)
Glucose-Capillary: 490 mg/dL — ABNORMAL HIGH (ref 65–99)

## 2016-10-01 LAB — URINALYSIS, COMPLETE (UACMP) WITH MICROSCOPIC
BACTERIA UA: NONE SEEN
Bilirubin Urine: NEGATIVE
Glucose, UA: >=500 mg/dL — AB
Hgb urine dipstick: NEGATIVE
KETONES UR: 20 mg/dL — AB
LEUKOCYTES UA: NEGATIVE
Nitrite: NEGATIVE
PH: 6 (ref 5.0–8.0)
PROTEIN: NEGATIVE mg/dL
RBC / HPF: NONE SEEN RBC/hpf (ref 0–5)
SQUAMOUS EPITHELIAL / LPF: NONE SEEN
Specific Gravity, Urine: 1.03 (ref 1.005–1.030)

## 2016-10-01 LAB — CBC
HEMATOCRIT: 44.4 % (ref 40.0–52.0)
Hemoglobin: 14.8 g/dL (ref 13.0–18.0)
MCH: 28 pg (ref 26.0–34.0)
MCHC: 33.4 g/dL (ref 32.0–36.0)
MCV: 83.9 fL (ref 80.0–100.0)
Platelets: 195 10*3/uL (ref 150–440)
RBC: 5.29 MIL/uL (ref 4.40–5.90)
RDW: 13.2 % (ref 11.5–14.5)
WBC: 9.6 10*3/uL (ref 3.8–10.6)

## 2016-10-01 LAB — TROPONIN I: Troponin I: <0.03 ng/mL (ref <0.03)

## 2016-10-01 MED ORDER — SODIUM CHLORIDE 0.9 % IV BOLUS (SEPSIS)
1000.0000 mL | Freq: Once | INTRAVENOUS | Status: AC
  Administered 2016-10-01: 1000 mL via INTRAVENOUS

## 2016-10-01 MED ORDER — METFORMIN HCL 500 MG PO TABS: 1000.0000 mg | ORAL_TABLET | Freq: Two times a day (BID) | ORAL | 1 refills | Status: DC

## 2016-10-01 MED ORDER — INSULIN ASPART 100 UNIT/ML ~~LOC~~ SOLN
10.0000 [IU] | Freq: Once | SUBCUTANEOUS | Status: AC
  Administered 2016-10-01: 10 [IU] via INTRAVENOUS
  Filled 2016-10-01: qty 10

## 2016-10-01 MED ORDER — ONDANSETRON HCL 4 MG PO TABS: 4.0000 mg | ORAL_TABLET | Freq: Every day | ORAL | 0 refills | Status: DC | PRN

## 2016-10-01 MED ORDER — METFORMIN HCL 500 MG PO TABS
500.0000 mg | ORAL_TABLET | Freq: Once | ORAL | Status: AC
  Administered 2016-10-01: 500 mg via ORAL
  Filled 2016-10-01: qty 1

## 2016-10-01 NOTE — ED Notes (Signed)
Pt reports he came to er for elevated blood sugar - pt was here last week and his blood sugar has not been below 360 since last visit - c/o blurry vision and feeling tired/weak - pt just dx with DM last Thursday

## 2016-10-01 NOTE — ED Notes (Addendum)
MD at bedside to educate pt on follow up needs and changes in medications until follow up. Family at bedside with pt and MD.

## 2016-10-01 NOTE — ED Notes (Signed)
Blood sugar here upon arrival is 490
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
## 2016-10-01 NOTE — ED Triage Notes (Signed)
Pt seen here one week ago for hyperglycemia; discharged home from ED with recommendation to followup with his primary; pt says he followed up with his MD, his blood sugar was not checked in the office, and no changes were made to his medications; pt says sugars have been over 350 all week and tonight at home his reading was 589; pt says he feels fatigued; ambulatory with steady gait;

## 2016-10-05 ENCOUNTER — Inpatient Hospital Stay (HOSPITAL_COMMUNITY)
Admission: EM | Admit: 2016-10-05 | Discharge: 2016-10-07 | DRG: 287 | Disposition: A | Discharge status: Home / Self Care | Payer: Commercial Managed Care - HMO | Attending: Cardiovascular Disease | Admitting: Cardiovascular Disease

## 2016-10-05 ENCOUNTER — Ambulatory Visit (HOSPITAL_COMMUNITY): Admit: 2016-10-05 | Payer: Commercial Managed Care - HMO | Admitting: Cardiovascular Disease

## 2016-10-05 ENCOUNTER — Encounter (HOSPITAL_COMMUNITY): Payer: Self-pay | Admitting: Cardiovascular Disease

## 2016-10-05 ENCOUNTER — Encounter (HOSPITAL_COMMUNITY)
Admission: EM | Disposition: A | Discharge status: Home / Self Care | Payer: Self-pay | Source: Home / Self Care | Attending: Cardiovascular Disease

## 2016-10-05 DIAGNOSIS — I10 Essential (primary) hypertension: Secondary | ICD-10-CM | POA: Diagnosis present

## 2016-10-05 DIAGNOSIS — B9789 Other viral agents as the cause of diseases classified elsewhere: Secondary | ICD-10-CM | POA: Diagnosis present

## 2016-10-05 DIAGNOSIS — Z7984 Long term (current) use of oral hypoglycemic drugs: Secondary | ICD-10-CM

## 2016-10-05 DIAGNOSIS — Z79899 Other long term (current) drug therapy: Secondary | ICD-10-CM | POA: Diagnosis not present

## 2016-10-05 DIAGNOSIS — Z87891 Personal history of nicotine dependence: Secondary | ICD-10-CM

## 2016-10-05 DIAGNOSIS — I249 Acute ischemic heart disease, unspecified: Secondary | ICD-10-CM | POA: Diagnosis present

## 2016-10-05 DIAGNOSIS — R079 Chest pain, unspecified: Secondary | ICD-10-CM | POA: Diagnosis present

## 2016-10-05 DIAGNOSIS — Z833 Family history of diabetes mellitus: Secondary | ICD-10-CM | POA: Diagnosis not present

## 2016-10-05 DIAGNOSIS — I301 Infective pericarditis: Secondary | ICD-10-CM | POA: Diagnosis not present

## 2016-10-05 DIAGNOSIS — I300 Acute nonspecific idiopathic pericarditis: Secondary | ICD-10-CM | POA: Diagnosis not present

## 2016-10-05 DIAGNOSIS — I309 Acute pericarditis, unspecified: Secondary | ICD-10-CM

## 2016-10-05 DIAGNOSIS — I319 Disease of pericardium, unspecified: Secondary | ICD-10-CM | POA: Diagnosis present

## 2016-10-05 DIAGNOSIS — Z6841 Body Mass Index (BMI) 40.0 and over, adult: Secondary | ICD-10-CM

## 2016-10-05 DIAGNOSIS — Z8249 Family history of ischemic heart disease and other diseases of the circulatory system: Secondary | ICD-10-CM | POA: Diagnosis not present

## 2016-10-05 DIAGNOSIS — E1165 Type 2 diabetes mellitus with hyperglycemia: Secondary | ICD-10-CM | POA: Diagnosis present

## 2016-10-05 DIAGNOSIS — I308 Other forms of acute pericarditis: Secondary | ICD-10-CM

## 2016-10-05 DIAGNOSIS — E1169 Type 2 diabetes mellitus with other specified complication: Secondary | ICD-10-CM | POA: Diagnosis present

## 2016-10-05 DIAGNOSIS — R072 Precordial pain: Secondary | ICD-10-CM | POA: Diagnosis not present

## 2016-10-05 HISTORY — PX: LEFT HEART CATH AND CORONARY ANGIOGRAPHY: CATH118249

## 2016-10-05 LAB — PROTIME-INR
INR: 0.99
PROTHROMBIN TIME: 13.1 s (ref 11.4–15.2)

## 2016-10-05 LAB — LIPID PANEL
Cholesterol: 141 mg/dL (ref 0–200)
HDL: 25 mg/dL — AB (ref >40)
LDL CALC: 65 mg/dL (ref 0–99)
TRIGLYCERIDES: 253 mg/dL — AB (ref <150)
Total CHOL/HDL Ratio: 5.6 RATIO
VLDL: 51 mg/dL — AB (ref 0–40)

## 2016-10-05 LAB — TROPONIN I: Troponin I: <0.03 ng/mL (ref <0.03)

## 2016-10-05 LAB — GLUCOSE, CAPILLARY
GLUCOSE-CAPILLARY: 350 mg/dL — AB (ref 65–99)
GLUCOSE-CAPILLARY: 348 mg/dL — AB (ref 65–99)
GLUCOSE-CAPILLARY: 292 mg/dL — AB (ref 65–99)

## 2016-10-05 LAB — I-STAT CHEM 8, ED
BUN: 13 mg/dL (ref 6–20)
CALCIUM ION: 1.06 mmol/L — AB (ref 1.15–1.40)
CHLORIDE: 93 mmol/L — AB (ref 101–111)
Creatinine, Ser: 1.1 mg/dL (ref 0.61–1.24)
GLUCOSE: 441 mg/dL — AB (ref 65–99)
HCT: 43 % (ref 39.0–52.0)
HEMOGLOBIN: 14.6 g/dL (ref 13.0–17.0)
Potassium: 4 mmol/L (ref 3.5–5.1)
SODIUM: 131 mmol/L — AB (ref 135–145)
TCO2: 25 mmol/L (ref 0–100)

## 2016-10-05 LAB — DIFFERENTIAL
Basophils Absolute: 0 10*3/uL (ref 0.0–0.1)
Basophils Relative: 0 %
EOS PCT: 0 %
Eosinophils Absolute: 0 10*3/uL (ref 0.0–0.7)
LYMPHS ABS: 2.3 10*3/uL (ref 0.7–4.0)
LYMPHS PCT: 17 %
MONO ABS: 1 10*3/uL (ref 0.1–1.0)
MONOS PCT: 8 %
NEUTROS ABS: 10.1 10*3/uL — AB (ref 1.7–7.7)
Neutrophils Relative %: 75 %

## 2016-10-05 LAB — COMPREHENSIVE METABOLIC PANEL
ALBUMIN: 3.9 g/dL (ref 3.5–5.0)
ALK PHOS: 97 U/L (ref 38–126)
ALT: 15 U/L — ABNORMAL LOW (ref 17–63)
ANION GAP: 12 (ref 5–15)
AST: 20 U/L (ref 15–41)
BILIRUBIN TOTAL: 1.4 mg/dL — AB (ref 0.3–1.2)
BUN: 9 mg/dL (ref 6–20)
CALCIUM: 9 mg/dL (ref 8.9–10.3)
CO2: 24 mmol/L (ref 22–32)
Chloride: 94 mmol/L — ABNORMAL LOW (ref 101–111)
Creatinine, Ser: 1.18 mg/dL (ref 0.61–1.24)
GFR calc Af Amer: >60 mL/min (ref >60)
GFR calc non Af Amer: >60 mL/min (ref >60)
GLUCOSE: 428 mg/dL — AB (ref 65–99)
Potassium: 4.2 mmol/L (ref 3.5–5.1)
SODIUM: 130 mmol/L — AB (ref 135–145)
TOTAL PROTEIN: 7 g/dL (ref 6.5–8.1)

## 2016-10-05 LAB — CBC
HCT: 40.6 % (ref 39.0–52.0)
HEMATOCRIT: 38.9 % — AB (ref 39.0–52.0)
HEMOGLOBIN: 12.6 g/dL — AB (ref 13.0–17.0)
Hemoglobin: 13.1 g/dL (ref 13.0–17.0)
MCH: 27.5 pg (ref 26.0–34.0)
MCH: 27.6 pg (ref 26.0–34.0)
MCHC: 32.3 g/dL (ref 30.0–36.0)
MCHC: 32.4 g/dL (ref 30.0–36.0)
MCV: 85.1 fL (ref 78.0–100.0)
MCV: 85.1 fL (ref 78.0–100.0)
Platelets: 206 10*3/uL (ref 150–400)
Platelets: 204 10*3/uL (ref 150–400)
RBC: 4.77 MIL/uL (ref 4.22–5.81)
RBC: 4.57 MIL/uL (ref 4.22–5.81)
RDW: 12.4 % (ref 11.5–15.5)
RDW: 12.4 % (ref 11.5–15.5)
WBC: 13.5 10*3/uL — ABNORMAL HIGH (ref 4.0–10.5)
WBC: 11.9 10*3/uL — AB (ref 4.0–10.5)

## 2016-10-05 LAB — MRSA PCR SCREENING: MRSA by PCR: NEGATIVE

## 2016-10-05 LAB — I-STAT TROPONIN, ED: TROPONIN I, POC: 0 ng/mL (ref 0.00–0.08)

## 2016-10-05 LAB — CREATININE, SERUM: Creatinine, Ser: 1.14 mg/dL (ref 0.61–1.24)

## 2016-10-05 LAB — APTT: aPTT: 29 seconds (ref 24–36)

## 2016-10-05 SURGERY — LEFT HEART CATH AND CORONARY ANGIOGRAPHY
Anesthesia: LOCAL

## 2016-10-05 MED ORDER — NITROGLYCERIN 1 MG/10 ML FOR IR/CATH LAB
INTRA_ARTERIAL | Status: AC
  Filled 2016-10-05: qty 10

## 2016-10-05 MED ORDER — IRBESARTAN 150 MG PO TABS
300.0000 mg | ORAL_TABLET | Freq: Every day | ORAL | Status: DC
  Administered 2016-10-06 – 2016-10-07 (×2): 300 mg via ORAL
  Filled 2016-10-05 (×3): qty 2

## 2016-10-05 MED ORDER — LIDOCAINE HCL (PF) 1 % IJ SOLN
INTRAMUSCULAR | Status: AC
  Filled 2016-10-05: qty 30

## 2016-10-05 MED ORDER — DIAZEPAM 5 MG PO TABS: 5.0000 mg | ORAL_TABLET | Freq: Three times a day (TID) | ORAL | Status: DC | PRN

## 2016-10-05 MED ORDER — FENTANYL CITRATE (PF) 100 MCG/2ML IJ SOLN
INTRAMUSCULAR | Status: DC | PRN
  Administered 2016-10-05: 25 ug via INTRAVENOUS

## 2016-10-05 MED ORDER — OXYCODONE-ACETAMINOPHEN 5-325 MG PO TABS: 1.0000 | ORAL_TABLET | ORAL | Status: DC | PRN

## 2016-10-05 MED ORDER — INSULIN ASPART 100 UNIT/ML ~~LOC~~ SOLN
0.0000 [IU] | Freq: Three times a day (TID) | SUBCUTANEOUS | Status: DC
  Administered 2016-10-05 – 2016-10-06 (×3): 11 [IU] via SUBCUTANEOUS
  Administered 2016-10-06: 8 [IU] via SUBCUTANEOUS
  Administered 2016-10-06: 15 [IU] via SUBCUTANEOUS
  Administered 2016-10-07: 8 [IU] via SUBCUTANEOUS

## 2016-10-05 MED ORDER — HEPARIN (PORCINE) IN NACL 2-0.9 UNIT/ML-% IJ SOLN
INTRAMUSCULAR | Status: DC | PRN
  Administered 2016-10-05: 1000 mL

## 2016-10-05 MED ORDER — SODIUM CHLORIDE 0.9 % IV SOLN
INTRAVENOUS | Status: DC | PRN
  Administered 2016-10-05: 100 mL/h via INTRAVENOUS

## 2016-10-05 MED ORDER — SODIUM CHLORIDE 0.9% FLUSH
3.0000 mL | INTRAVENOUS | Status: DC | PRN
  Administered 2016-10-06: 3 mL via INTRAVENOUS
  Filled 2016-10-05: qty 3

## 2016-10-05 MED ORDER — SODIUM CHLORIDE 0.9 % IV SOLN
INTRAVENOUS | Status: AC
  Administered 2016-10-05: 11:00:00 via INTRAVENOUS

## 2016-10-05 MED ORDER — AMLODIPINE BESYLATE 5 MG PO TABS
5.0000 mg | ORAL_TABLET | Freq: Every day | ORAL | Status: DC
  Administered 2016-10-06 – 2016-10-07 (×2): 5 mg via ORAL
  Filled 2016-10-05 (×2): qty 1

## 2016-10-05 MED ORDER — HEPARIN SODIUM (PORCINE) 1000 UNIT/ML IJ SOLN
INTRAMUSCULAR | Status: AC
  Filled 2016-10-05: qty 1

## 2016-10-05 MED ORDER — HEPARIN (PORCINE) IN NACL 2-0.9 UNIT/ML-% IJ SOLN
INTRAMUSCULAR | Status: AC
  Filled 2016-10-05: qty 1000

## 2016-10-05 MED ORDER — IOPAMIDOL (ISOVUE-370) INJECTION 76%
INTRAVENOUS | Status: AC
  Filled 2016-10-05: qty 100

## 2016-10-05 MED ORDER — ACETAMINOPHEN 325 MG PO TABS: 650.0000 mg | ORAL_TABLET | ORAL | Status: DC | PRN

## 2016-10-05 MED ORDER — MIDAZOLAM HCL 2 MG/2ML IJ SOLN
INTRAMUSCULAR | Status: DC | PRN
  Administered 2016-10-05: 1 mg via INTRAVENOUS

## 2016-10-05 MED ORDER — INSULIN GLARGINE 100 UNIT/ML ~~LOC~~ SOLN
20.0000 [IU] | Freq: Every day | SUBCUTANEOUS | Status: DC
  Administered 2016-10-05: 20 [IU] via SUBCUTANEOUS
  Filled 2016-10-05 (×2): qty 0.2

## 2016-10-05 MED ORDER — IBUPROFEN 800 MG PO TABS
800.0000 mg | ORAL_TABLET | Freq: Three times a day (TID) | ORAL | Status: DC
  Administered 2016-10-05 – 2016-10-07 (×6): 800 mg via ORAL
  Filled 2016-10-05: qty 1
  Filled 2016-10-05 (×5): qty 4

## 2016-10-05 MED ORDER — HEPARIN SODIUM (PORCINE) 1000 UNIT/ML IJ SOLN
INTRAMUSCULAR | Status: DC | PRN
  Administered 2016-10-05: 5000 [IU] via INTRAVENOUS

## 2016-10-05 MED ORDER — IOPAMIDOL (ISOVUE-370) INJECTION 76%
INTRAVENOUS | Status: DC | PRN
  Administered 2016-10-05: 90 mL via INTRA_ARTERIAL

## 2016-10-05 MED ORDER — ONDANSETRON HCL 4 MG/2ML IJ SOLN: 4.0000 mg | Freq: Four times a day (QID) | INTRAMUSCULAR | Status: DC | PRN

## 2016-10-05 MED ORDER — OLMESARTAN-AMLODIPINE-HCTZ 40-5-25 MG PO TABS: 1.0000 | ORAL_TABLET | Freq: Every day | ORAL | Status: DC

## 2016-10-05 MED ORDER — VERAPAMIL HCL 2.5 MG/ML IV SOLN
INTRAVENOUS | Status: DC | PRN
  Administered 2016-10-05: 10 mL via INTRA_ARTERIAL

## 2016-10-05 MED ORDER — VERAPAMIL HCL 2.5 MG/ML IV SOLN
INTRAVENOUS | Status: AC
  Filled 2016-10-05: qty 2

## 2016-10-05 MED ORDER — FAMOTIDINE 20 MG PO TABS
20.0000 mg | ORAL_TABLET | Freq: Every day | ORAL | Status: DC
  Administered 2016-10-06 – 2016-10-07 (×2): 20 mg via ORAL
  Filled 2016-10-05 (×2): qty 1

## 2016-10-05 MED ORDER — SODIUM CHLORIDE 0.9% FLUSH
3.0000 mL | Freq: Two times a day (BID) | INTRAVENOUS | Status: DC
  Administered 2016-10-05 – 2016-10-07 (×4): 3 mL via INTRAVENOUS

## 2016-10-05 MED ORDER — HEPARIN SODIUM (PORCINE) 5000 UNIT/ML IJ SOLN
5000.0000 [IU] | Freq: Three times a day (TID) | INTRAMUSCULAR | Status: DC
  Administered 2016-10-05 – 2016-10-06 (×5): 5000 [IU] via SUBCUTANEOUS
  Filled 2016-10-05 (×5): qty 1

## 2016-10-05 MED ORDER — SODIUM CHLORIDE 0.9 % IV SOLN: 250.0000 mL | INTRAVENOUS | Status: DC | PRN

## 2016-10-05 MED ORDER — COLCHICINE 0.6 MG PO TABS
0.6000 mg | ORAL_TABLET | Freq: Two times a day (BID) | ORAL | Status: DC
  Administered 2016-10-05 – 2016-10-07 (×5): 0.6 mg via ORAL
  Filled 2016-10-05 (×5): qty 1

## 2016-10-05 MED ORDER — LIDOCAINE HCL (PF) 1 % IJ SOLN
INTRAMUSCULAR | Status: DC | PRN
  Administered 2016-10-05: 2 mL

## 2016-10-05 MED ORDER — HYDROCHLOROTHIAZIDE 25 MG PO TABS
25.0000 mg | ORAL_TABLET | Freq: Every day | ORAL | Status: DC
  Administered 2016-10-06 – 2016-10-07 (×2): 25 mg via ORAL
  Filled 2016-10-05 (×2): qty 1

## 2016-10-05 SURGICAL SUPPLY — 13 items
CATH 5FR JL3.5 JR4 ANG PIG MP (CATHETERS) ×1 IMPLANT
DEVICE RAD COMP TR BAND LRG (VASCULAR PRODUCTS) ×1 IMPLANT
ELECT DEFIB PAD ADLT CADENCE (PAD) ×1 IMPLANT
GLIDESHEATH SLEND SS 6F .021 (SHEATH) ×2 IMPLANT
GUIDEWIRE INQWIRE 1.5J.035X260 (WIRE) ×1 IMPLANT
HOVERMATT SINGLE USE (MISCELLANEOUS) ×2 IMPLANT
INQWIRE 1.5J .035X260CM (WIRE) ×2
KIT ENCORE 26 ADVANTAGE (KITS) IMPLANT
KIT HEART LEFT (KITS) ×2 IMPLANT
PACK CARDIAC CATHETERIZATION (CUSTOM PROCEDURE TRAY) ×2 IMPLANT
SYR MEDRAD MARK V 150ML (SYRINGE) ×2 IMPLANT
TRANSDUCER W/STOPCOCK (MISCELLANEOUS) ×2 IMPLANT
TUBING CIL FLEX 10 FLL-RA (TUBING) ×2 IMPLANT
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
## 2016-10-05 NOTE — Progress Notes (Signed)
   10/05/16 1005  Clinical Encounter Type  Visited With Patient  Visit Type Other (Comment) ([facility])  Spiritual Encounters  Spiritual Needs Emotional  Stress Factors  Patient Stress Factors None identified  Pt went straight to Cath Lab. Should family show be available for page.

## 2016-10-06 ENCOUNTER — Inpatient Hospital Stay (HOSPITAL_COMMUNITY): Payer: Commercial Managed Care - HMO

## 2016-10-06 ENCOUNTER — Encounter (HOSPITAL_COMMUNITY): Payer: Self-pay | Admitting: *Deleted

## 2016-10-06 DIAGNOSIS — I300 Acute nonspecific idiopathic pericarditis: Secondary | ICD-10-CM

## 2016-10-06 DIAGNOSIS — I319 Disease of pericardium, unspecified: Secondary | ICD-10-CM | POA: Diagnosis present

## 2016-10-06 DIAGNOSIS — R072 Precordial pain: Secondary | ICD-10-CM

## 2016-10-06 LAB — GLUCOSE, CAPILLARY
GLUCOSE-CAPILLARY: 279 mg/dL — AB (ref 65–99)
GLUCOSE-CAPILLARY: 283 mg/dL — AB (ref 65–99)
GLUCOSE-CAPILLARY: 288 mg/dL — AB (ref 65–99)
GLUCOSE-CAPILLARY: 403 mg/dL — AB (ref 65–99)
Glucose-Capillary: 343 mg/dL — ABNORMAL HIGH (ref 65–99)
Glucose-Capillary: 384 mg/dL — ABNORMAL HIGH (ref 65–99)
Glucose-Capillary: 447 mg/dL — ABNORMAL HIGH (ref 65–99)

## 2016-10-06 LAB — BASIC METABOLIC PANEL
ANION GAP: 9 (ref 5–15)
BUN: 12 mg/dL (ref 6–20)
CALCIUM: 8.7 mg/dL — AB (ref 8.9–10.3)
CO2: 28 mmol/L (ref 22–32)
Chloride: 97 mmol/L — ABNORMAL LOW (ref 101–111)
Creatinine, Ser: 1.12 mg/dL (ref 0.61–1.24)
GFR calc Af Amer: >60 mL/min (ref >60)
GFR calc non Af Amer: >60 mL/min (ref >60)
Glucose, Bld: 301 mg/dL — ABNORMAL HIGH (ref 65–99)
Potassium: 3.6 mmol/L (ref 3.5–5.1)
Sodium: 134 mmol/L — ABNORMAL LOW (ref 135–145)

## 2016-10-06 LAB — ECHOCARDIOGRAM COMPLETE
HEIGHTINCHES: 74.5 in
WEIGHTICAEL: 5235.2 [oz_av]

## 2016-10-06 LAB — HEMOGLOBIN A1C
HEMOGLOBIN A1C: 12.3 % — AB (ref 4.8–5.6)
Mean Plasma Glucose: 306 mg/dL

## 2016-10-06 LAB — HIV ANTIBODY (ROUTINE TESTING W REFLEX): HIV Screen 4th Generation wRfx: NONREACTIVE

## 2016-10-06 MED ORDER — LIVING WELL WITH DIABETES BOOK
Freq: Once | Status: AC
  Administered 2016-10-06: 12:00:00
  Filled 2016-10-06: qty 1

## 2016-10-06 MED ORDER — INSULIN ASPART 100 UNIT/ML ~~LOC~~ SOLN: 0.0000 [IU] | Freq: Three times a day (TID) | SUBCUTANEOUS | Status: DC

## 2016-10-06 MED ORDER — INSULIN GLARGINE 100 UNIT/ML ~~LOC~~ SOLN
30.0000 [IU] | Freq: Every day | SUBCUTANEOUS | Status: DC
  Administered 2016-10-06: 30 [IU] via SUBCUTANEOUS
  Filled 2016-10-06: qty 0.3

## 2016-10-06 MED ORDER — INSULIN ASPART 100 UNIT/ML ~~LOC~~ SOLN
0.0000 [IU] | Freq: Every day | SUBCUTANEOUS | Status: DC
  Administered 2016-10-06: 5 [IU] via SUBCUTANEOUS

## 2016-10-06 NOTE — Progress Notes (Signed)
Report given to 3W25 nurse.

## 2016-10-06 NOTE — Progress Notes (Signed)
Progress Note  Patient Name: Jonathan Compton Date of Encounter: 10/06/2016  Primary Cardiologist: New to Dr. Excell Seltzerooper  Subjective   Chest pain and shortness of breath worsen with movement and deep breath, improving. Normal coronaries by cath.   Inpatient Medications    Scheduled Meds: . irbesartan  300 mg Oral Daily   And  . amLODipine  5 mg Oral Daily   And  . hydrochlorothiazide  25 mg Oral Daily  . colchicine  0.6 mg Oral BID  . famotidine  20 mg Oral Daily  . heparin  5,000 Units Subcutaneous Q8H  . ibuprofen  800 mg Oral TID  . insulin aspart  0-15 Units Subcutaneous TID WC  . insulin glargine  20 Units Subcutaneous QHS  . sodium chloride flush  3 mL Intravenous Q12H   Continuous Infusions:  PRN Meds: sodium chloride, acetaminophen, diazepam, ondansetron (ZOFRAN) IV, oxyCODONE-acetaminophen, sodium chloride flush   Vital Signs    Vitals:   10/05/16 2047 10/06/16 0045 10/06/16 0428 10/06/16 0700  BP: 108/72 121/77 118/67 137/89  Pulse: 91 91 84 90  Resp: 15 18 17  (!) 26  Temp: 98 F (36.7 C) 98.8 F (37.1 C) 98.5 F (36.9 C) 98.5 F (36.9 C)  TempSrc: Oral Oral Oral Oral  SpO2: 96% 95% 95% 97%  Weight:      Height:        Intake/Output Summary (Last 24 hours) at 10/06/16 [id number] Last data filed at 10/05/16 2000  Gross per 24 hour  Intake             1475 ml  Output             1000 ml  Net              475 ml   Filed Weights   10/05/16 1139  Weight: (!) 327 lb 3.2 oz (148.4 kg)    Telemetry    NSR - Personally Reviewed  ECG    EKG unable to review in Epic or chart  Physical Exam   JWJ:XBJYNWGNGEN:Morbidly obese male in no acute distress.   Neck: No JVD Cardiac: RRR, no murmurs, rubs, or gallops. Right wrist without hematoma Respiratory: Clear to auscultation bilaterally. GI: Soft, nontender, non-distended  MS: No edema; No deformity. Neuro:  Nonfocal  Psych: Normal affect   Labs    Chemistry Recent Labs Lab 10/01/16 2054 10/05/16 1000  10/05/16 1006 10/05/16 1304 10/06/16 0223  NA 128* 130* 131*  --  134*  K 4.3 4.2 4.0  --  3.6  CL 94* 94* 93*  --  97*  CO2 25 24  --   --  28  GLUCOSE 509* 428* 441*  --  301*  BUN 22* 9 13  --  12  CREATININE 1.23 1.18 1.10 1.14 1.12  CALCIUM 9.5 9.0  --   --  8.7*  PROT  --  7.0  --   --   --   ALBUMIN  --  3.9  --   --   --   AST  --  20  --   --   --   ALT  --  15*  --   --   --   ALKPHOS  --  97  --   --   --   BILITOT  --  1.4*  --   --   --   GFRNONAA >60 >60  --  >60 >60  GFRAA >60 >60  --  >  60 >60  ANIONGAP 9 12  --   --  9     Hematology Recent Labs Lab 10/01/16 2054 10/05/16 1000 10/05/16 1006 10/05/16 1304  WBC 9.6 13.5*  --  11.9*  RBC 5.29 4.77  --  4.57  HGB 14.8 13.1 14.6 12.6*  HCT 44.4 40.6 43.0 38.9*  MCV 83.9 85.1  --  85.1  MCH 28.0 27.5  --  27.6  MCHC 33.4 32.3  --  32.4  RDW 13.2 12.4  --  12.4  PLT 195 206  --  204    Cardiac Enzymes Recent Labs Lab 10/01/16 2054 10/05/16 1000  TROPONINI <0.03 <0.03    Recent Labs Lab 10/05/16 1004  TROPIPOC 0.00     BNPNo results for input(s): BNP, PROBNP in the last 168 hours.   DDimer No results for input(s): DDIMER in the last 168 hours.   Radiology    No results found.  Cardiac Studies   Left Heart Cath and Coronary Angiography    10/05/16  Conclusion   1. Widely patent coronary arteries with no obstructive disease 2. Slow flow in the LAD 3. Normal LV function  The patient does not appear to have any evidence of an acute coronary syndrome. I suspect he has acute carditis. He will be treated accordingly with nonsteroidal anti-inflammatory drugs and colchicine. Will admit overnight.     Patient Profile     46 y.o. male w/ PMHx significant for diabetes, morbid obesity who presented to [facility] on 10/05/2016 with complaints of chest pain. EKG concerning for acute anterolateral STEMI versus pericarditis (unable to review). Due to significant cardiac risk factors he was  taken for emergent cardiac catheterization which showed widely patent coronaries.   Assessment & Plan    1. Acute pericarditis likely viral etiology  - Normal coronaries on cath. He has been sick since [place]. Initially had cold--> bronchitis--> last month treated with abx for possible pneumonia. He is still recovering. No fever or chills but has intermittent cough. - Intermittent sharp/stabbing substernal chest pain that worse with movement and deep breath. symptoms minimally improved on NSAIDS and colchicine. Pending echocardiogram.   2. Uncontrolled diabetics - A1c of 11 on 09/26/16. Blood sugar in 300-400s. On SSI here.  3. Morbid obesity - Advised active lifestyle with regular exercise and  healthy eating. Weight loss recommended.   Signed, Manson Passey, PA  10/06/2016, 8:33 AM    Agree with note by Chelsea Aus PA-C  Patient with acute coronary syndrome. Status post left heart cath radially that was entirely normal. Presumptive diagnosis of acute pericarditis on appropriate medications. Pain is improved. He has some inspiratory/pleuritic pain. There is no rub on exam. We will transfer him to telemetry today and hopefully home tomorrow.  Runell Gess, M.D., FACP, [facility], [facility] [address] [place], [place]  [address]  [phone] 10/06/2016 9:11 AM

## 2016-10-06 NOTE — Progress Notes (Signed)
  Echocardiogram 2D Echocardiogram has been performed.  Arvil ChacoFoster, Shaurya Rawdon 10/06/2016, 12:39 PM

## 2016-10-06 NOTE — Progress Notes (Addendum)
Inpatient Diabetes Program Recommendations  AACE/ADA: New Consensus Statement on Inpatient Glycemic Control (2015)  Target Ranges:  Prepandial:   less than 140 mg/dL      Peak postprandial:   less than 180 mg/dL (1-2 hours)      Critically ill patients:  140 - 180 mg/dL   Review of Glycemic Control  Diabetes history: New diagnosis recently in February this year saw PCP on 09/26/2016 referred to outpatient education at that time Outpatient Diabetes medications: Metformin 1000 mg BID Current orders for Inpatient glycemic control: Lantus 20, Novolog Moderate TID  Inpatient Diabetes Program Recommendations:   Due to patient's weight and A1c level, please consider increasing Lantus to 30-35 units (0.25 units/kg). Also consider adding Novolog HS scale.  Thanks,  Christena DeemShannon Palin RN, MSN, [facility] Inpatient Diabetes Coordinator Team Pager [phone] (8a-5p)

## 2016-10-06 NOTE — Progress Notes (Signed)
Patient transfered to [phone] with all belongings.

## 2016-10-07 ENCOUNTER — Encounter: Payer: Self-pay | Admitting: *Deleted

## 2016-10-07 ENCOUNTER — Encounter (HOSPITAL_COMMUNITY): Payer: Self-pay | Admitting: *Deleted

## 2016-10-07 DIAGNOSIS — I300 Acute nonspecific idiopathic pericarditis: Secondary | ICD-10-CM | POA: Diagnosis not present

## 2016-10-07 LAB — GLUCOSE, CAPILLARY
Glucose-Capillary: 289 mg/dL — ABNORMAL HIGH (ref 65–99)
Glucose-Capillary: 403 mg/dL — ABNORMAL HIGH (ref 65–99)

## 2016-10-07 MED ORDER — COLCHICINE 0.6 MG PO TABS: 0.6000 mg | ORAL_TABLET | Freq: Two times a day (BID) | ORAL | 2 refills | Status: DC

## 2016-10-07 MED ORDER — IBUPROFEN 600 MG PO TABS: 600.0000 mg | ORAL_TABLET | Freq: Three times a day (TID) | ORAL | Status: DC

## 2016-10-07 MED ORDER — INSULIN ASPART 100 UNIT/ML ~~LOC~~ SOLN
20.0000 [IU] | Freq: Once | SUBCUTANEOUS | Status: AC
  Administered 2016-10-07: 20 [IU] via SUBCUTANEOUS

## 2016-10-07 MED ORDER — IBUPROFEN 600 MG PO TABS: 600.0000 mg | ORAL_TABLET | Freq: Three times a day (TID) | ORAL | 0 refills | Status: AC

## 2016-10-07 NOTE — Discharge Summary (Signed)
Discharge Summary    Patient ID: Jonathan Compton,  MRN: [id number], DOB/AGE: August 23, 1970 46 y.o.  Admit date: 10/05/2016 Discharge date: 10/07/2016  Primary Care Provider: Ria Bush Primary Cardiologist: Dr. Burt Knack  Discharge Diagnoses    Principal Problem:   Acute pericarditis Active Problems:   Diabetes mellitus type 2, uncontrolled, with complications ([place])   History of Present Illness     Jonathan Compton is a 46 y.o. male with past medical history of uncontrolled Type 2 DM and no prior cardiac history who presented to [facility] ED on 10/05/2016 as a CODE STEMI.   Reported developing chest discomfort the previous night which has been constant since onset and worse with inspiration and positional movements. His EKG showed anterolateral ST segment elevation so he was taken for an emergent cardiac catheterization.   Hospital Course     Consultants: None  Cardiac cath showed normal coronary arteries, therefore his presentation was thought to be most consistent with acute pericarditis. Reported being diagnosed with a cold two months prior, then bronchitis, then treated for PNA last month. He was started on Colchicine and Ibuprofen.   Cyclic troponin values remained negative. Echo showed a normal EF with no evidence of pericardial effusion. Hgb A1c elevated at 12.3. Blood sugars remained elevated in the 200's - 400's with glucose in the 200's on the day of discharge.   He was last examined by Dr. Gwenlyn Found on 10/07/2016 and deemed stable for discharge. He will continue on Ibuprofen 673m TID for 14 days along with Colchicine 0.661mBID for 3 months. PTA Zantac was resumed for GI protection. Has close follow-up with his PCP on 10/10/2016 in regards to his Type 2 DM. Cardiology follow-up was arranged and he was discharged home in good condition.   _____________  Discharge Vitals Blood pressure 112/70, pulse 83, temperature 97.7 F (36.5 C), temperature source Oral, resp. rate  18, height 6' 2.5" (1.892 m), weight (!) 318 lb 4.8 oz (144.4 kg), SpO2 95 %.  Filed Weights   10/05/16 1139 10/06/16 2209 10/07/16 0604  Weight: (!) 327 lb 3.2 oz (148.4 kg) (!) 320 lb 14.4 oz (145.6 kg) (!) 318 lb 4.8 oz (144.4 kg)    Labs & Radiologic Studies     CBC  Recent Labs  10/05/16 1000 10/05/16 1006 10/05/16 1304  WBC 13.5*  --  11.9*  NEUTROABS 10.1*  --   --   HGB 13.1 14.6 12.6*  HCT 40.6 43.0 38.9*  MCV 85.1  --  85.1  PLT 206  --  20419 Basic Metabolic Panel  Recent Labs  10/05/16 1000 10/05/16 1006 10/05/16 1304 10/06/16 0223  NA 130* 131*  --  134*  K 4.2 4.0  --  3.6  CL 94* 93*  --  97*  CO2 24  --   --  28  GLUCOSE 428* 441*  --  301*  BUN 9 13  --  12  CREATININE 1.18 1.10 1.14 1.12  CALCIUM 9.0  --   --  8.7*   Liver Function Tests  Recent Labs  10/05/16 1000  AST 20  ALT 15*  ALKPHOS 97  BILITOT 1.4*  PROT 7.0  ALBUMIN 3.9   No results for input(s): LIPASE, AMYLASE in the last 72 hours. Cardiac Enzymes  Recent Labs  10/05/16 1000  TROPONINI <0.03   BNP Invalid input(s): POCBNP D-Dimer No results for input(s): DDIMER in the last 72 hours. Hemoglobin A1C  Recent Labs  10/05/16 1304  HGBA1C 12.3*   Fasting Lipid Panel  Recent Labs  10/05/16 1000  CHOL 141  HDL 25*  LDLCALC 65  TRIG 253*  CHOLHDL 5.6   Thyroid Function Tests No results for input(s): TSH, T4TOTAL, T3FREE, THYROIDAB in the last 72 hours.  Invalid input(s): FREET3  No results found.   Diagnostic Studies/Procedures     Cardiac Catheterization: 10/05/2016 1. Widely patent coronary arteries with no obstructive disease 2. Slow flow in the LAD 3. Normal LV function  The patient does not appear to have any evidence of an acute coronary syndrome. I suspect he has acute carditis. He will be treated accordingly with nonsteroidal anti-inflammatory drugs and colchicine. Will admit overnight.   Echocardiogram: 10/06/2016 Study  Conclusions  - Left ventricle: The cavity size was normal. Systolic function was normal. The estimated ejection fraction was in the range of 55% to 60%. Wall motion was normal; there were no regional wall motion abnormalities. Left ventricular diastolic function parameters were normal. - Aortic valve: Trileaflet; normal thickness, mildly calcified leaflets. - Mitral valve: Calcified annulus. - Pulmonic valve: There was no regurgitation. - Pulmonary arteries: Systolic pressure could not be accurately estimated.   Disposition   Pt is being discharged home today in good condition.  Follow-up Plans & Appointments    Follow-up Information    Bhagat,Bhavinkumar, PA Follow up on 10/31/2016.   Specialty:  Cardiology Why:  Cardiology Hospital Follow-Up on 10/31/2016 with [facility] (Dr. Antionette Char PA).  Contact information: [address] [place] [address] [phone]          Discharge Instructions    Diet - low sodium heart healthy    Complete by:  As directed    Discharge instructions    Complete by:  As directed    PLEASE REMEMBER TO BRING ALL OF YOUR MEDICATIONS TO EACH OF YOUR FOLLOW-UP OFFICE VISITS.  PLEASE ATTEND ALL SCHEDULED FOLLOW-UP APPOINTMENTS.   Activity: Increase activity slowly as tolerated. You may shower, but no soaking baths (or swimming) for 1 week. No driving for 24 hours. No lifting over 5 lbs for 1 week. No sexual activity for 1 week.   You May Return to Work: on Monday  Wound Care: You may wash cath site gently with soap and water. Keep cath site clean and dry. If you notice pain, swelling, bleeding or pus at your cath site, please call [phone].   Increase activity slowly    Complete by:  As directed       Discharge Medications     Medication List    TAKE these medications   colchicine 0.6 MG tablet Take 1 tablet (0.6 mg total) by mouth 2 (two) times daily.   glucose blood test strip ONETOUCH VERIO: USE TO  CHECK BLOOD SUGAR TWICE DAILY AND PRN (DX. E11.8 & E11.65)   ibuprofen 600 MG tablet Commonly known as:  ADVIL,MOTRIN Take 1 tablet (600 mg total) by mouth 3 (three) times daily.   Lancets 30G Misc USE TO CHECK BLOOD SUGAR TWICE DAILY AND PRN (DX. E11.8 & E11.65)   metFORMIN 500 MG tablet Commonly known as:  GLUCOPHAGE Take 2 tablets (1,000 mg total) by mouth 2 (two) times daily with a meal.   MULTIVITAMIN ADULT Tabs Take 1 tablet by mouth daily.   Olmesartan-Amlodipine-HCTZ 40-5-25 MG Tabs Take 1 tablet by mouth daily.   ondansetron 4 MG tablet Commonly known as:  ZOFRAN Take 1 tablet (4 mg total) by mouth daily as needed  for nausea or vomiting.   ONETOUCH VERIO w/Device Kit 1 Device by Other route 2 (two) times daily as needed. ONETOUCH VERIO: USE TO CHECK BLOOD SUGAR TWICE DAILY AND PRN (DX. E11.8 & E11.65)   PROBIOTIC DAILY PO Take 1 tablet by mouth daily.   ranitidine 75 MG tablet Commonly known as:  ZANTAC 75 Take 1 tablet (75 mg total) by mouth 2 (two) times daily.        Allergies No Known Allergies   Outstanding Labs/Studies   None  Duration of Discharge Encounter   Greater than 30 minutes including physician time.  Signed, Erma Heritage, PA-C 10/07/2016, 3:47 PM  Agree with note by Guinevere Scarlet  Patient admitted for chest pain with EKG changes. Cardiac catheterization was normal. The pupils presumptive diagnosis with acute pericarditis. His pain is resolved. He is on appropriate medications. His exam is benign. He will be discharged home today.  Lorretta Harp, M.D., [place], [facility], [place] [facility] [address] [place], [place]  [address]  [phone] 10/07/2016 4:27 PM

## 2016-10-07 NOTE — Progress Notes (Signed)
Progress Note  Patient Name: Jonathan Compton Date of Encounter: 10/07/2016  Primary Cardiologist: Dr. Excell Seltzer  Subjective   Denies any recurrent chest pain or dyspnea. Ambulating around the halls without difficulty.   Inpatient Medications    Scheduled Meds: . irbesartan  300 mg Oral Daily   And  . amLODipine  5 mg Oral Daily   And  . hydrochlorothiazide  25 mg Oral Daily  . colchicine  0.6 mg Oral BID  . famotidine  20 mg Oral Daily  . heparin  5,000 Units Subcutaneous Q8H  . ibuprofen  800 mg Oral TID  . insulin aspart  0-15 Units Subcutaneous TID WC  . insulin aspart  0-5 Units Subcutaneous QHS  . insulin aspart  20 Units Subcutaneous Once  . insulin glargine  30 Units Subcutaneous QHS  . sodium chloride flush  3 mL Intravenous Q12H   Continuous Infusions:  PRN Meds: sodium chloride, acetaminophen, diazepam, ondansetron (ZOFRAN) IV, oxyCODONE-acetaminophen, sodium chloride flush   Vital Signs    Vitals:   10/07/16 0603 10/07/16 0604 10/07/16 0724 10/07/16 1104  BP: 112/74  133/86 112/70  Pulse: 79  78 83  Resp: 18  18 18   Temp: 97.8 F (36.6 C)  97.7 F (36.5 C) 97.7 F (36.5 C)  TempSrc: Oral  Oral Oral  SpO2: 95%  95% 95%  Weight:  (!) 318 lb 4.8 oz (144.4 kg)    Height:        Intake/Output Summary (Last 24 hours) at 10/07/16 1125 Last data filed at 10/06/16 2230  Gross per 24 hour  Intake              635 ml  Output                0 ml  Net              635 ml   Filed Weights   10/05/16 1139 10/06/16 2209 10/07/16 0604  Weight: (!) 327 lb 3.2 oz (148.4 kg) (!) 320 lb 14.4 oz (145.6 kg) (!) 318 lb 4.8 oz (144.4 kg)    Telemetry    NSR, HR in 80's  - 90's. No ectopic events.  - Personally Reviewed  ECG    No new tracings.   Physical Exam   General: Well developed, obese African American male appearing in no acute distress. Head: Normocephalic, atraumatic.  Neck: Supple without bruits, JVD not elevated. Lungs:  Resp regular and  unlabored, CTA without wheezing or rales. Heart: RRR, S1, S2, no S3, S4, or murmur; no rub. Abdomen: Soft, non-tender, non-distended with normoactive bowel sounds. No hepatomegaly. No rebound/guarding. No obvious abdominal masses. Extremities: No clubbing, cyanosis, or edema. Distal pedal pulses are 2+ bilaterally. Right radial site without ecchymosis or evidence of a hematoma.  Neuro: Alert and oriented X 3. Moves all extremities spontaneously. Psych: Normal affect.  Labs    Chemistry Recent Labs Lab 10/01/16 2054 10/05/16 1000 10/05/16 1006 10/05/16 1304 10/06/16 0223  NA 128* 130* 131*  --  134*  K 4.3 4.2 4.0  --  3.6  CL 94* 94* 93*  --  97*  CO2 25 24  --   --  28  GLUCOSE 509* 428* 441*  --  301*  BUN 22* 9 13  --  12  CREATININE 1.23 1.18 1.10 1.14 1.12  CALCIUM 9.5 9.0  --   --  8.7*  PROT  --  7.0  --   --   --  ALBUMIN  --  3.9  --   --   --   AST  --  20  --   --   --   ALT  --  15*  --   --   --   ALKPHOS  --  97  --   --   --   BILITOT  --  1.4*  --   --   --   GFRNONAA >60 >60  --  >60 >60  GFRAA >60 >60  --  >60 >60  ANIONGAP 9 12  --   --  9     Hematology Recent Labs Lab 10/01/16 2054 10/05/16 1000 10/05/16 1006 10/05/16 1304  WBC 9.6 13.5*  --  11.9*  RBC 5.29 4.77  --  4.57  HGB 14.8 13.1 14.6 12.6*  HCT 44.4 40.6 43.0 38.9*  MCV 83.9 85.1  --  85.1  MCH 28.0 27.5  --  27.6  MCHC 33.4 32.3  --  32.4  RDW 13.2 12.4  --  12.4  PLT 195 206  --  204    Cardiac Enzymes Recent Labs Lab 10/01/16 2054 10/05/16 1000  TROPONINI <0.03 <0.03    Recent Labs Lab 10/05/16 1004  TROPIPOC 0.00     BNPNo results for input(s): BNP, PROBNP in the last 168 hours.   DDimer No results for input(s): DDIMER in the last 168 hours.   Radiology    No results found.  Cardiac Studies   Cardiac Catheterization: 10/05/2016  1. Widely patent coronary arteries with no obstructive disease 2. Slow flow in the LAD 3. Normal LV function  The patient  does not appear to have any evidence of an acute coronary syndrome. I suspect he has acute carditis. He will be treated accordingly with nonsteroidal anti-inflammatory drugs and colchicine. Will admit overnight.  Echocardiogram: 10/06/2016 Study Conclusions  - Left ventricle: The cavity size was normal. Systolic function was   normal. The estimated ejection fraction was in the range of 55%   to 60%. Wall motion was normal; there were no regional wall   motion abnormalities. Left ventricular diastolic function   parameters were normal. - Aortic valve: Trileaflet; normal thickness, mildly calcified   leaflets. - Mitral valve: Calcified annulus. - Pulmonic valve: There was no regurgitation. - Pulmonary arteries: Systolic pressure could not be accurately   estimated.  Patient Profile     46 y.o. male w/ PMHx significant for uncontrolled diabetes and morbid obesitywho [facility] of chest pain. EKG concerning for acute anterolateral STEMI vs. pericarditis (unable to review). Due to significant cardiac risk factors he was taken for emergent cardiac catheterization which showed widely patent coronaries.   Assessment & Plan    1. Acute pericarditis  - presented with intermittent sharp/stabbing substernal chest pain, worse with movement and deep breathing. Likely of viral etiology, as he reports being sick since 06/2016 (Initially had cold--> bronchitis--> last month treated with abx for possible pneumonia).  - cyclic troponin values negative. Cath shows normal cors. Echo showing normal EF with no evidence of pericardial effusion.  - will continue on Ibuprofen 600mg  TID for 14 days along with Colchicine 0.6mg  BID for 3 months. On Zantac prior to admission. Continue for GI protection.   2. Uncontrolled Type 2 DM - A1c of 11.0 on 09/26/2016. Blood sugar in 300-400's. On SSI here. - glucose initially 289 this AM, consumed bacon and french toast for  breakfast, then at 403. -  reducing carb intake was reviewed with the patient.  - resume Metformin at time of discharge as 48 hours out from cath. Needs close follow-up with PCP (scheduled for next Monday).   3. Morbid obesity - Advised active lifestyle with regular exercise and healthy diet. Weight loss recommended.    Likely stable for discharge later today.   Lorri Frederick , PA-C 11:25 AM 10/07/2016 Pager: [phone]  Agree with note by Reita May  Patient a longer has any chest pain. He is being treated presumptively for pericarditis with appropriate medications. Exam is benign. Okay for discharge today. Follow-up with Dr. Excell Seltzer as an outpatient.   Runell Gess, M.D., FACP, [facility], [facility] [address] [place], [place]  [address]  [phone] 10/07/2016 12:14 PM

## 2016-10-07 NOTE — Progress Notes (Signed)
Reviewed discharge instructions with patient and he stated his understanding.  Reviewed radial site care with patient and diabetes education/CBG monitoring. Discharged home.  Colman Caterarpley, Tequila Rottmann Danielle

## 2016-10-07 NOTE — Discharge Instructions (Signed)
Nonspecific Chest Pain °Chest pain can be caused by many different conditions. There is always a chance that your pain could be related to something serious, such as a heart attack or a blood clot in your lungs. Chest pain can also be caused by conditions that are not life-threatening. If you have chest pain, it is very important to follow up with your health care provider. °What are the causes? °Causes of this condition include: °· Heartburn. °· Pneumonia or bronchitis. °· Anxiety or stress. °· Inflammation around your heart (pericarditis) or lung (pleuritis or pleurisy). °· A blood clot in your lung. °· A collapsed lung (pneumothorax). This can develop suddenly on its own (spontaneous pneumothorax) or from trauma to the chest. °· Shingles infection (varicella-zoster virus). °· Heart attack. °· Damage to the bones, muscles, and cartilage that make up your chest wall. This can include: °? Bruised bones due to injury. °? Strained muscles or cartilage due to frequent or repeated coughing or overwork. °? Fracture to one or more ribs. °? Sore cartilage due to inflammation (costochondritis). ° °What increases the risk? °Risk factors for this condition may include: °· Activities that increase your risk for trauma or injury to your chest. °· Respiratory infections or conditions that cause frequent coughing. °· Medical conditions or overeating that can cause heartburn. °· Heart disease or family history of heart disease. °· Conditions or health behaviors that increase your risk of developing a blood clot. °· Having had chicken pox (varicella zoster). ° °What are the signs or symptoms? °Chest pain can feel like: °· Burning or tingling on the surface of your chest or deep in your chest. °· Crushing, pressure, aching, or squeezing pain. °· Dull or sharp pain that is worse when you move, cough, or take a deep breath. °· Pain that is also felt in your back, neck, shoulder, or arm, or pain that spreads to any of these  areas. ° °Your chest pain may come and go, or it may stay constant. °How is this diagnosed? °Lab tests or other studies may be needed to find the cause of your pain. Your health care provider may have you take a test called an ECG (electrocardiogram). An ECG records your heartbeat patterns at the time the test is performed. You may also have other tests, such as: °· Transthoracic echocardiogram (TTE). In this test, sound waves are used to create a picture of the heart structures and to look at how blood flows through your heart. °· Transesophageal echocardiogram (TEE). This is a more advanced imaging test that takes images from inside your body. It allows your health care provider to see your heart in finer detail. °· Cardiac monitoring. This allows your health care provider to monitor your heart rate and rhythm in real time. °· Holter monitor. This is a portable device that records your heartbeat and can help to diagnose abnormal heartbeats. It allows your health care provider to track your heart activity for several days, if needed. °· Stress tests. These can be done through exercise or by taking medicine that makes your heart beat more quickly. °· Blood tests. °· Other imaging tests. ° °How is this treated? °Treatment depends on what is causing your chest pain. Treatment may include: °· Medicines. These may include: °? Acid blockers for heartburn. °? Anti-inflammatory medicine. °? Pain medicine for inflammatory conditions. °? Antibiotic medicine, if an infection is present. °? Medicines to dissolve blood clots. °? Medicines to treat coronary artery disease (CAD). °· Supportive care for conditions that   do not require medicines. This may include: °? Resting. °? Applying heat or cold packs to injured areas. °? Limiting activities until pain decreases. ° °Follow these instructions at home: °Medicines °· If you were prescribed an antibiotic, take it as told by your health care provider. Do not stop taking the  antibiotic even if you start to feel better. °· Take over-the-counter and prescription medicines only as told by your health care provider. °Lifestyle °· Do not use any products that contain nicotine or tobacco, such as cigarettes and e-cigarettes. If you need help quitting, ask your health care provider. °· Do not drink alcohol. °· Make lifestyle changes as directed by your health care provider. These may include: °? Getting regular exercise. Ask your health care provider to suggest some activities that are safe for you. °? Eating a heart-healthy diet. A registered dietitian can help you to learn healthy eating options. °? Maintaining a healthy weight. °? Managing diabetes, if necessary. °? Reducing stress, such as with yoga or relaxation techniques. °General instructions °· Avoid any activities that bring on chest pain. °· If heartburn is the cause for your chest pain, raise (elevate) the head of your bed about 6 inches (15 cm) by putting blocks under the legs. Sleeping with more pillows does not effectively relieve heartburn because it only changes the position of your head. °· Keep all follow-up visits as told by your health care provider. This is important. This includes any further testing if your chest pain does not go away. °Contact a health care provider if: °· Your chest pain does not go away. °· You have a rash with blisters on your chest. °· You have a fever. °· You have chills. °Get help right away if: °· Your chest pain is worse. °· You have a cough that gets worse, or you cough up blood. °· You have severe pain in your abdomen. °· You have severe weakness. °· You faint. °· You have sudden, unexplained chest discomfort. °· You have sudden, unexplained discomfort in your arms, back, neck, or jaw. °· You have shortness of breath at any time. °· You suddenly start to sweat, or your skin gets clammy. °· You feel nauseous or you vomit. °· You suddenly feel light-headed or dizzy. °· Your heart begins to beat  quickly, or it feels like it is skipping beats. °These symptoms may represent a serious problem that is an emergency. Do not wait to see if the symptoms will go away. Get medical help right away. Call your local emergency services (911 in the [place] Do not drive yourself to the hospital. °This information is not intended to replace advice given to you by your health care provider. Make sure you discuss any questions you have with your health care provider. °Document Released: 04/20/2005 Document Revised: 04/04/2016 Document Reviewed: 04/04/2016 °Elsevier Interactive Patient Education © 2017 [employer] ° °

## 2016-10-07 NOTE — Progress Notes (Signed)
Inpatient Diabetes Program Recommendations  AACE/ADA: New Consensus Statement on Inpatient Glycemic Control (2015)  Target Ranges:  Prepandial:   less than 140 mg/dL      Peak postprandial:   less than 180 mg/dL (1-2 hours)      Critically ill patients:  140 - 180 mg/dL   Results for Rudean HittWILLIS, Makena A (MRN [id number]) as of 10/07/2016 13:10  Ref. Range 10/06/2016 07:37 10/06/2016 11:25 10/06/2016 16:01 10/06/2016 20:13 10/06/2016 22:25  Glucose-Capillary Latest Ref Range: 65 - 99 mg/dL 086288 (H) 578343 (H) 469384 (H) 403 (H) 447 (H)   Results for Rudean HittWILLIS, Tilak A (MRN [id number]) as of 10/07/2016 13:10  Ref. Range 10/07/2016 07:23 10/07/2016 11:04  Glucose-Capillary Latest Ref Range: 65 - 99 mg/dL 244289 (H) 010403 (H)    Admit with: CP/ SOB  History: Recent Diagnosis of DM (February 2018)  Home DM Meds: Metformin 1000 mg BID  Current Insulin Orders: Lantus 30 units QHS      Novolog Moderate Correction Scale/ SSI (0-15 units) TID AC + HS     Spoke with pt about recent diagnosis.  Discussed A1C results with him and explained what an A1C is, basic pathophysiology of DM Type 2, basic home care, basic diabetes diet nutrition principles, importance of checking CBGs and maintaining good CBG control to prevent long-term and short-term complications.  Reviewed signs and symptoms of hyperglycemia and hypoglycemia and how to treat hypoglycemia at home.  Also reviewed blood sugar goals and A1c goals for home.    RNs to provide ongoing basic DM education at bedside with this patient.  Have ordered educational booklet.  Encouraged pt to watch DM videos.    Patient to follow up with his PCP on Monday.  Discussed with patient that he may likely need to take more medications at home (including the possibility of starting insulin) if lifestyle, nutrition, and Metformin cannot effectively manage CBGs.  Patient agreeable and stated he will follow closely with his PCP.  Patient appeared very motivated to get his CBGs under  control.     --Will follow patient during hospitalization--  Ambrose FinlandJeannine Johnston Tylee Yum RN, MSN, CDE Diabetes Coordinator Inpatient Glycemic Control Team Team Pager: [phone] (8a-5p)

## 2016-10-07 NOTE — Care Management Note (Signed)
Case Management Note  Patient Details  Name: Jonathan Compton MRN: [id number] Date of Birth: 10/27/1970  Subjective/Objective: Pt presented for acute pericarditis. Plan to be d/c on Colchicine. CM did a benefits check for medication and cost will be $50.00. Staff RN to make patient aware.                    Action/Plan: No home needs identified by CM at this time.   Expected Discharge Date:  10/07/16               Expected Discharge Plan:  Home/Self Care  In-House Referral:  NA  Discharge planning Services  CM Consult, Medication Assistance  Post Acute Care Choice:  NA Choice offered to:  NA  DME Arranged:  N/A DME Agency:  NA  HH Arranged:  NA HH Agency:  NA  Status of Service:  Completed, signed off  If discussed at Long Length of Stay Meetings, dates discussed:    Additional Comments:  Gala LewandowskyGraves-Bigelow, Paysen Goza Kaye, RN 10/07/2016, 12:37 PM

## 2016-10-07 NOTE — Plan of Care (Signed)
Problem: Education: Goal: Understanding of cardiac disease, CV risk reduction, and recovery process will improve Outcome: Completed/Met Date Met: 10/07/16 Reviewed diabetes booklet and patient provided documentation sheets for blood glucose monitoring.   

## 2016-10-07 NOTE — Plan of Care (Signed)
Problem: Activity: Goal: Ability to tolerate increased activity will improve Outcome: Completed/Met Date Met: 10/07/16 Ambulating independently in hallways without significant chest pain.

## 2016-10-10 ENCOUNTER — Encounter: Payer: Self-pay | Admitting: Family Medicine

## 2016-10-10 ENCOUNTER — Ambulatory Visit (INDEPENDENT_AMBULATORY_CARE_PROVIDER_SITE_OTHER): Payer: Commercial Managed Care - HMO | Admitting: Family Medicine

## 2016-10-10 VITALS — BP 130/90 | HR 72 | Temp 97.6°F | Wt 319.5 lb

## 2016-10-10 DIAGNOSIS — E1165 Type 2 diabetes mellitus with hyperglycemia: Secondary | ICD-10-CM

## 2016-10-10 DIAGNOSIS — I300 Acute nonspecific idiopathic pericarditis: Secondary | ICD-10-CM | POA: Diagnosis not present

## 2016-10-10 DIAGNOSIS — Z6841 Body Mass Index (BMI) 40.0 and over, adult: Secondary | ICD-10-CM

## 2016-10-10 DIAGNOSIS — E118 Type 2 diabetes mellitus with unspecified complications: Secondary | ICD-10-CM

## 2016-10-10 DIAGNOSIS — IMO0002 Reserved for concepts with insufficient information to code with codable children: Secondary | ICD-10-CM

## 2016-10-10 DIAGNOSIS — I10 Essential (primary) hypertension: Secondary | ICD-10-CM

## 2016-10-10 MED ORDER — INSULIN GLARGINE 100 UNIT/ML SOLOSTAR PEN: 10.0000 [IU] | PEN_INJECTOR | Freq: Every day | SUBCUTANEOUS | 1 refills | Status: DC

## 2016-10-10 MED ORDER — METFORMIN HCL 500 MG PO TABS: 500.0000 mg | ORAL_TABLET | Freq: Two times a day (BID) | ORAL | 11 refills | Status: DC

## 2016-10-10 MED ORDER — INSULIN PEN NEEDLE 31G X 5 MM MISC: 11 refills | Status: DC

## 2016-10-10 NOTE — Progress Notes (Signed)
Pre visit review using our clinic review tool, if applicable. No additional management support is needed unless otherwise documented below in the visit note. 

## 2016-10-10 NOTE — Progress Notes (Addendum)
BP 130/90   Pulse 72   Temp 97.6 F (36.4 C) (Oral)   Wt (!) 319 lb 8 oz (144.9 kg)   BMI 40.47 kg/m    CC: DM and hospital f/u visit Subjective:    Patient ID: Jonathan Compton, male    DOB: 22-Jul-1971, 46 y.o.   MRN: [id number]  HPI: [name] is a 46 y.o. male presenting on 10/10/2016 for Follow-up (DM; hospital)   Recent complicated course after new diagnosis diabetes in last few months. Hospitalized with chest pain, r/o ACS with normal cardiac catheterization. Dx post-viral pericarditis. Treated with colchicine and ibuprofen - planned continued for next few months. f/u with cardiology scheduled for April.   DM - regularly does check sugars 2-3 times daily fasting and before dinner: 300-400s. Brings glucose meter. Has drastically changed diet - no sweetened beverages, no added sugar or fast foods. Compliant with antihyperglycemic regimen which includes: metformin 1033m bid - ongoing diarrhea and gi upset since titration 3/10. No low sugars. Some L foot paresthesias. Some blurry vision. Last diabetic eye exam DUE. Pneumovax: declines. Prevnar: none. Has DSME classes set up.  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 Out of work since 09/24/2016.   Admit date: 10/05/2016 Discharge date: 10/07/2016 F/u phone call: not performed as pt seen on next business day.  Primary Care Provider: JRia BushPrimary Cardiologist: Dr. CBurt Knack Discharge Dx: Principal Problem:   Acute pericarditis Active Problems:   Diabetes mellitus type 2, uncontrolled, with complications ([place]  Relevant past medical, surgical, family and social history reviewed and updated as indicated. Interim medical history since our last visit reviewed. Allergies and medications reviewed and updated. Outpatient Medications Prior to Visit    Medication Sig Dispense Refill  . Blood Glucose Monitoring Suppl (ONETOUCH VERIO) w/Device KIT 1 Device by Other route 2 (two) times daily as needed. ONETOUCH VERIO: USE TO CHECK BLOOD SUGAR TWICE DAILY AND PRN (DX. E11.8 & E11.65) 1 kit 0  . colchicine 0.6 MG tablet Take 1 tablet (0.6 mg total) by mouth 2 (two) times daily. 60 tablet 2  . glucose blood test strip ONETOUCH VERIO: USE TO CHECK BLOOD SUGAR TWICE DAILY AND PRN (DX. E11.8 & E11.65) 100 each 1  . ibuprofen (ADVIL,MOTRIN) 600 MG tablet Take 1 tablet (600 mg total) by mouth 3 (three) times daily. 42 tablet 0  . Lancets 30G MISC USE TO CHECK BLOOD SUGAR TWICE DAILY AND PRN (DX. E11.8 & E11.65) 100 each 1  . Multiple Vitamins-Minerals (MULTIVITAMIN ADULT) TABS Take 1 tablet by mouth daily.    . Olmesartan-Amlodipine-HCTZ 40-5-25 MG TABS Take 1 tablet by mouth daily. 30 tablet 11  . ondansetron (ZOFRAN) 4 MG tablet Take 1 tablet (4 mg total) by mouth daily as needed for nausea or vomiting. 14 tablet 0  . Probiotic Product (PROBIOTIC DAILY PO) Take 1 tablet by mouth daily.    . ranitidine (ZANTAC 75) 75 MG tablet Take 1 tablet (75 mg total) by mouth 2 (two) times daily. 30 tablet 3  . metFORMIN (GLUCOPHAGE) 500 MG tablet Take 2 tablets (1,000 mg total) by mouth 2 (two) times daily with a meal. 60 tablet 1   Facility-Administered Medications Prior to Visit  Medication Dose Route Frequency Provider Last Rate  Last Dose  . 0.9 %  sodium chloride infusion    Continuous PRN Sherren Mocha, MD 100 mL/hr at 10/05/16 1017 100 mL/hr at 10/05/16 1017  . fentaNYL (SUBLIMAZE) injection    PRN Sherren Mocha, MD   25 mcg at 10/05/16 1018  . heparin infusion 2 units/mL in 0.9 % sodium chloride    Continuous PRN Sherren Mocha, MD   1,000 mL at 10/05/16 1043  . heparin injection    PRN Sherren Mocha, MD   5,000 Units at 10/05/16 1023  . iopamidol (ISOVUE-370) 76 % injection    PRN Sherren Mocha, MD   90 mL at 10/05/16 1043  . lidocaine (PF) (XYLOCAINE)  1 % injection    PRN Sherren Mocha, MD   2 mL at 10/05/16 1020  . midazolam (VERSED) injection    PRN Sherren Mocha, MD   1 mg at 10/05/16 1018  . Radial Cocktail/Verapamil only    PRN Sherren Mocha, MD   10 mL at 10/05/16 1022     Per HPI unless specifically indicated in ROS section below Review of Systems     Objective:    BP 130/90   Pulse 72   Temp 97.6 F (36.4 C) (Oral)   Wt (!) 319 lb 8 oz (144.9 kg)   BMI 40.47 kg/m   Wt Readings from Last 3 Encounters:  10/10/16 (!) 319 lb 8 oz (144.9 kg)  10/07/16 (!) 318 lb 4.8 oz (144.4 kg)  10/01/16 (!) 323 lb (146.5 kg)    Physical Exam  Constitutional: He appears well-developed and well-nourished. No distress.  HENT:  Mouth/Throat: Oropharynx is clear and moist. No oropharyngeal exudate.  Eyes: Conjunctivae and EOM are normal. Pupils are equal, round, and reactive to light.  Neck: Normal range of motion. Neck supple.  Cardiovascular: Normal rate, regular rhythm, normal heart sounds and intact distal pulses.   No murmur heard. Pulmonary/Chest: Effort normal and breath sounds normal. No respiratory distress. He has no wheezes. He has no rales.  Musculoskeletal: Normal range of motion. He exhibits no edema.  Skin: Skin is warm and dry. No rash noted.  Psychiatric: He has a normal mood and affect.  Nursing note and vitals reviewed.  Lab Results  Component Value Date   HGBA1C 12.3 (H) 10/05/2016       Assessment & Plan:   Problem List Items Addressed This Visit    Acute pericarditis    Recent hospitalization for presumed post-viral pericarditis, currently undergoing treatment with NSAID and colchicine. Continue. Chest pain has largely resolved. rec keep f/u with cardiology scheduled for next month.      Diabetes mellitus type 2, uncontrolled, with complications ([place]) - Primary    Again reviewed recent diagnosis as well as recent glucometer readings - persistently high despite metformin '1000mg'$  bid, not tolerating  well. Will decrease metformin to '500mg'$  BID in hopes of improved GI upset, start lantus 10u daily with instructions on quick titration to 30 u nightly.  Upcoming appt with DSME. Reviewed insulin use in setting of job - pt will check to see if this will be affected.  Discussed routine foot and eye exams.  RTC 3-4 wks f/u visit. Endorsing some blurry vision which should improve as sugar control improves.       Relevant Medications   Insulin Glargine (LANTUS SOLOSTAR) 100 UNIT/ML Solostar Pen   metFORMIN (GLUCOPHAGE) 500 MG tablet   Hypertension    Chronic, stable. Continue current regimen.       Morbid obesity  with BMI of 40.0-44.9, adult ([place])    Congratulated on weight loss noted in setting of healthy diet changes.  Pending appt with nutritionist this week.       Relevant Medications   Insulin Glargine (LANTUS SOLOSTAR) 100 UNIT/ML Solostar Pen   metFORMIN (GLUCOPHAGE) 500 MG tablet       Follow up plan: Return in about 4 weeks (around 11/07/2016) for follow up visit.  Ria Bush, MD

## 2016-10-10 NOTE — Patient Instructions (Addendum)
Schedule eye exam at your convenience.  Decrease metformin to 500mg  twice daily.  Start long acting basal insulin lantus 10 units nightly every night.  Increase dose by 2 units every 2 days if average sugar >150 to max 30 units per night.  Return for follow up in 3-4 weeks.  Return to work Wednesday.   Diabetes Mellitus and Food It is important for you to manage your blood sugar (glucose) level. Your blood glucose level can be greatly affected by what you eat. Eating healthier foods in the appropriate amounts throughout the day at about the same time each day will help you control your blood glucose level. It can also help slow or prevent worsening of your diabetes mellitus. Healthy eating may even help you improve the level of your blood pressure and reach or maintain a healthy weight. General recommendations for healthful eating and cooking habits include:  Eating meals and snacks regularly. Avoid going long periods of time without eating to lose weight.  Eating a diet that consists mainly of plant-based foods, such as fruits, vegetables, nuts, legumes, and whole grains.  Using low-heat cooking methods, such as baking, instead of high-heat cooking methods, such as deep frying. Work with your dietitian to make sure you understand how to use the Nutrition Facts information on food labels. How can food affect me? Carbohydrates  Carbohydrates affect your blood glucose level more than any other type of food. Your dietitian will help you determine how many carbohydrates to eat at each meal and teach you how to count carbohydrates. Counting carbohydrates is important to keep your blood glucose at a healthy level, especially if you are using insulin or taking certain medicines for diabetes mellitus. Alcohol  Alcohol can cause sudden decreases in blood glucose (hypoglycemia), especially if you use insulin or take certain medicines for diabetes mellitus. Hypoglycemia can be a life-threatening condition.  Symptoms of hypoglycemia (sleepiness, dizziness, and disorientation) are similar to symptoms of having too much alcohol. If your health care provider has given you approval to drink alcohol, do so in moderation and use the following guidelines:  Women should not have more than one drink per day, and men should not have more than two drinks per day. One drink is equal to:  12 oz of beer.  5 oz of wine.  1 oz of hard liquor.  Do not drink on an empty stomach.  Keep yourself hydrated. Have water, diet soda, or unsweetened iced tea.  Regular soda, juice, and other mixers might contain a lot of carbohydrates and should be counted. What foods are not recommended? As you make food choices, it is important to remember that all foods are not the same. Some foods have fewer nutrients per serving than other foods, even though they might have the same number of calories or carbohydrates. It is difficult to get your body what it needs when you eat foods with fewer nutrients. Examples of foods that you should avoid that are high in calories and carbohydrates but low in nutrients include:  Trans fats (most processed foods list trans fats on the Nutrition Facts label).  Regular soda.  Juice.  Candy.  Sweets, such as cake, pie, doughnuts, and cookies.  Fried foods. What foods can I eat? Eat nutrient-rich foods, which will nourish your body and keep you healthy. The food you should eat also will depend on several factors, including:  The calories you need.  The medicines you take.  Your weight.  Your blood glucose level.  Your blood pressure level.  Your cholesterol level. You should eat a variety of foods, including:  Protein.  Lean cuts of meat.  Proteins low in saturated fats, such as fish, egg whites, and beans. Avoid processed meats.  Fruits and vegetables.  Fruits and vegetables that may help control blood glucose levels, such as apples, mangoes, and yams.  Dairy  products.  Choose fat-free or low-fat dairy products, such as milk, yogurt, and cheese.  Grains, bread, pasta, and rice.  Choose whole grain products, such as multigrain bread, whole oats, and brown rice. These foods may help control blood pressure.  Fats.  Foods containing healthful fats, such as nuts, avocado, olive oil, canola oil, and fish. Does everyone with diabetes mellitus have the same meal plan? Because every person with diabetes mellitus is different, there is not one meal plan that works for everyone. It is very important that you meet with a dietitian who will help you create a meal plan that is just right for you. This information is not intended to replace advice given to you by your health care provider. Make sure you discuss any questions you have with your health care provider. Document Released: 04/07/2005 Document Revised: 12/17/2015 Document Reviewed: 06/07/2013 Elsevier Interactive Patient Education  2017 [employer]

## 2016-10-10 NOTE — Assessment & Plan Note (Addendum)
Again reviewed recent diagnosis as well as recent glucometer readings - persistently high despite metformin 1000mg  bid, not tolerating well. Will decrease metformin to 500mg  BID in hopes of improved GI upset, start lantus 10u daily with instructions on quick titration to 30 u nightly.  Upcoming appt with DSME. Reviewed insulin use in setting of job - pt will check to see if this will be affected.  Discussed routine foot and eye exams.  RTC 3-4 wks f/u visit. Endorsing some blurry vision which should improve as sugar control improves.

## 2016-10-10 NOTE — Assessment & Plan Note (Signed)
Recent hospitalization for presumed post-viral pericarditis, currently undergoing treatment with NSAID and colchicine. Continue. Chest pain has largely resolved. rec keep f/u with cardiology scheduled for next month.

## 2016-10-10 NOTE — Assessment & Plan Note (Addendum)
Congratulated on weight loss noted in setting of healthy diet changes.  Pending appt with nutritionist this week.

## 2016-10-10 NOTE — Assessment & Plan Note (Signed)
Chronic, stable. Continue current regimen. 
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
## 2016-10-31 ENCOUNTER — Ambulatory Visit (INDEPENDENT_AMBULATORY_CARE_PROVIDER_SITE_OTHER): Payer: Commercial Managed Care - HMO | Admitting: Physician Assistant

## 2016-10-31 ENCOUNTER — Encounter (INDEPENDENT_AMBULATORY_CARE_PROVIDER_SITE_OTHER): Payer: Self-pay

## 2016-10-31 ENCOUNTER — Encounter: Payer: Self-pay | Admitting: Physician Assistant

## 2016-10-31 VITALS — BP 124/90 | HR 74 | Ht 74.5 in | Wt 313.0 lb

## 2016-10-31 DIAGNOSIS — T7121XA Asphyxiation due to cave-in or falling earth, initial encounter: Secondary | ICD-10-CM | POA: Diagnosis not present

## 2016-10-31 DIAGNOSIS — I319 Disease of pericardium, unspecified: Secondary | ICD-10-CM

## 2016-10-31 DIAGNOSIS — I10 Essential (primary) hypertension: Secondary | ICD-10-CM | POA: Diagnosis not present

## 2016-10-31 DIAGNOSIS — M7918 Myalgia, other site: Secondary | ICD-10-CM

## 2016-10-31 DIAGNOSIS — M791 Myalgia: Secondary | ICD-10-CM | POA: Diagnosis not present

## 2016-10-31 NOTE — Patient Instructions (Addendum)
Medication Instructions:  Your physician recommends that you continue on your current medications as directed. Please refer to the Current Medication list given to you today. TAKE OVER THE COUNTER IBUPROFEN 600 MG TWICE A DAY FOR MUSCLE PAIN. IF YOU DON'T SEE ANY IMPROVEMENT, YOU WILL NEED TO F/U WITH YOUR PRIMARY CARE DR.  Labwork: None ordered  Testing/Procedures: None ordered  Follow-Up: Your physician wants you to follow-up in: 6  MONTHS WITH DR. Theodoro Parma will receive a reminder letter in the mail two months in advance. If you don't receive a letter, please call our office to schedule the follow-up appointment.    Any Other Special Instructions Will Be Listed Below (If Applicable).     If you need a refill on your cardiac medications before your next appointment, please call your pharmacy.

## 2016-11-01 ENCOUNTER — Ambulatory Visit: Payer: Commercial Managed Care - HMO | Admitting: Family Medicine

## 2016-11-01 ENCOUNTER — Telehealth: Payer: Self-pay | Admitting: Family Medicine

## 2016-11-01 NOTE — Telephone Encounter (Signed)
Pt missed appt today. plz call for update - to get last few sugar readings, and see what dose of lantus patient is up to (slow titration from 10 -> 30u daily)

## 2016-11-02 NOTE — Telephone Encounter (Signed)
Spoke with patient. He said he forgot about appt and apologized. I rescheduled for Friday. He is up to 24 units of Lantus. Last night sugar was 151 before supper and was 248 this AM. He did eat cereal last night before bed. Sugar has been running in the 250's fasting in the AM. He is asking about "leaky liver" which is something they have mentioned in the diabetes education class.

## 2016-11-02 NOTE — Telephone Encounter (Signed)
Will see Friday

## 2016-11-03 ENCOUNTER — Other Ambulatory Visit: Payer: Self-pay | Admitting: Family Medicine

## 2016-11-04 ENCOUNTER — Encounter (INDEPENDENT_AMBULATORY_CARE_PROVIDER_SITE_OTHER): Payer: Self-pay

## 2016-11-04 ENCOUNTER — Ambulatory Visit (INDEPENDENT_AMBULATORY_CARE_PROVIDER_SITE_OTHER): Payer: Commercial Managed Care - HMO | Admitting: Family Medicine

## 2016-11-04 ENCOUNTER — Telehealth: Payer: Self-pay

## 2016-11-04 ENCOUNTER — Encounter: Payer: Self-pay | Admitting: Family Medicine

## 2016-11-04 VITALS — BP 128/82 | HR 72 | Temp 97.8°F | Wt 308.8 lb

## 2016-11-04 DIAGNOSIS — E1165 Type 2 diabetes mellitus with hyperglycemia: Secondary | ICD-10-CM

## 2016-11-04 DIAGNOSIS — IMO0002 Reserved for concepts with insufficient information to code with codable children: Secondary | ICD-10-CM

## 2016-11-04 DIAGNOSIS — Z794 Long term (current) use of insulin: Secondary | ICD-10-CM | POA: Diagnosis not present

## 2016-11-04 DIAGNOSIS — E118 Type 2 diabetes mellitus with unspecified complications: Secondary | ICD-10-CM

## 2016-11-04 MED ORDER — BASAGLAR KWIKPEN 100 UNIT/ML ~~LOC~~ SOPN: 24.0000 [IU] | PEN_INJECTOR | Freq: Every day | SUBCUTANEOUS | 11 refills | Status: DC

## 2016-11-04 NOTE — Assessment & Plan Note (Signed)
Congratulated on continued healthy diet changes and weight loss noted.

## 2016-11-04 NOTE — Patient Instructions (Addendum)
Labs today.  Keep up the good work - with healthy diet and lifestyle changes!  Schedule eye exam at your convenience.  Return 2 mo f/u visit Ok to continue lantus titration - stop if average sugar <120.

## 2016-11-04 NOTE — Assessment & Plan Note (Signed)
Significant improvement in sugar control endorsed by patient. Tolerating lower metformin dose  bid along with lantus. Discussed ongoing lantus titration and meal timing. Check fructosamine today. Reassess in 1 mo at CPE.

## 2016-11-04 NOTE — Progress Notes (Signed)
BP 128/82   Pulse 72   Temp 97.8 F (36.6 C) (Oral)   Wt (!) 308 lb 12 oz (140 kg)   BMI 39.11 kg/m    CC: DM f/u visit Subjective:    Patient ID: Jonathan Compton, male    DOB: 30-Jan-1971, 46 y.o.   MRN: [id number]  HPI: Jonathan Compton is a 46 y.o. male presenting on 11/04/2016 for Follow-up   DM - regularly does check sugars ~2 times daily fasting: this morning 180 fasting, last night 150. Compliant with antihyperglycemic regimen which includes: metformin  bid (limited by GI upset), lantus 24 units daily. Has not yet started exercise regimen - but more active in the yard recently. Denies low sugars or hypoglycemic symptoms. Denies paresthesias. Last diabetic eye exam DUE.  Pneumovax: declines.  Prevnar: not due. Attending diabetes classes.  Lab Results  Component Value Date   HGBA1C 12.3 (H) 10/05/2016   Diabetic Foot Exam - Simple   No data filed       Recent ER visit for pericarditis, saw cardiologist in f/u.   Relevant past medical, surgical, family and social history reviewed and updated as indicated. Interim medical history since our last visit reviewed. Allergies and medications reviewed and updated. Outpatient Medications Prior to Visit  Medication Sig Dispense Refill  . colchicine 0.6 MG tablet Take 1 tablet (0.6 mg total) by mouth 2 (two) times daily. 60 tablet 2  . Insulin Glargine (LANTUS SOLOSTAR) 100 UNIT/ML Solostar Pen Inject 10 Units into the skin daily at 10 pm. (Patient taking differently: Inject 24 Units into the skin daily at 10 pm. ) 5 pen 1  . metFORMIN (GLUCOPHAGE) 500 MG tablet Take 1 tablet (500 mg total) by mouth 2 (two) times daily with a meal. 60 tablet 11  . Multiple Vitamins-Minerals (MULTIVITAMIN ADULT) TABS Take 1 tablet by mouth daily.    . Probiotic Product (PROBIOTIC DAILY PO) Take 1 tablet by mouth daily.    . ranitidine (ZANTAC 75) 75 MG tablet Take 1 tablet (75 mg total) by mouth 2 (two) times daily. 30 tablet 3  . TRIBENZOR  40-5-25 MG TABS TAKE 1 TABLET BY MOUTH DAILY. 30 tablet 3  . ondansetron (ZOFRAN) 4 MG tablet Take 1 tablet (4 mg total) by mouth daily as needed for nausea or vomiting. 14 tablet 0   Facility-Administered Medications Prior to Visit  Medication Dose Route Frequency Provider Last Rate Last Dose  . 0.9 %  sodium chloride infusion    Continuous PRN Tonny Bollman, MD 100 mL/hr at 10/05/16 1017 100 mL/hr at 10/05/16 1017  . fentaNYL (SUBLIMAZE) injection    PRN Tonny Bollman, MD   25 mcg at 10/05/16 1018  . heparin infusion 2 units/mL in 0.9 % sodium chloride    Continuous PRN Tonny Bollman, MD   1,000 mL at 10/05/16 1043  . heparin injection    PRN Tonny Bollman, MD   5,000 Units at 10/05/16 1023  . iopamidol (ISOVUE-370) 76 % injection    PRN Tonny Bollman, MD   90 mL at 10/05/16 1043  . lidocaine (PF) (XYLOCAINE) 1 % injection    PRN Tonny Bollman, MD   2 mL at 10/05/16 1020  . midazolam (VERSED) injection    PRN Tonny Bollman, MD   1 mg at 10/05/16 1018  . Radial Cocktail/Verapamil only    PRN Tonny Bollman, MD   10 mL at 10/05/16 1022     Per HPI unless specifically indicated in ROS  section below Review of Systems     Objective:    BP 128/82   Pulse 72   Temp 97.8 F (36.6 C) (Oral)   Wt (!) 308 lb 12 oz (140 kg)   BMI 39.11 kg/m   Wt Readings from Last 3 Encounters:  11/04/16 (!) 308 lb 12 oz (140 kg)  10/31/16 (!) 313 lb (142 kg)  10/10/16 (!) 319 lb 8 oz (144.9 kg)    Physical Exam  Constitutional: He appears well-developed and well-nourished. No distress.  HENT:  Head: Normocephalic and atraumatic.  Right Ear: External ear normal.  Left Ear: External ear normal.  Nose: Nose normal.  Mouth/Throat: Oropharynx is clear and moist. No oropharyngeal exudate.  Eyes: Conjunctivae and EOM are normal. Pupils are equal, round, and reactive to light. No scleral icterus.  Neck: Normal range of motion. Neck supple. No thyromegaly present.  Cardiovascular: Normal rate, regular  rhythm, normal heart sounds and intact distal pulses.   No murmur heard. Pulmonary/Chest: Effort normal and breath sounds normal. No respiratory distress. He has no wheezes. He has no rales.  Musculoskeletal: He exhibits no edema.  See HPI for foot exam if done  Lymphadenopathy:    He has no cervical adenopathy.  Skin: Skin is warm and dry. No rash noted.  Psychiatric: He has a normal mood and affect.  Nursing note and vitals reviewed.      Assessment & Plan:   Problem List Items Addressed This Visit    Diabetes mellitus type 2, uncontrolled, with complications (HCC) - Primary    Significant improvement in sugar control endorsed by patient. Tolerating lower metformin dose  bid along with lantus. Discussed ongoing lantus titration and meal timing. Check fructosamine today. Reassess in 1 mo at CPE.       Relevant Orders   Fructosamine   Severe obesity (BMI 35.0-39.9) with comorbidity (HCC)    Congratulated on continued healthy diet changes and weight loss noted.           Follow up plan: No Follow-up on file.  Eustaquio Boyden, MD

## 2016-11-04 NOTE — Telephone Encounter (Signed)
Anna at [facility] left v/m; lantus not covered by ins but basicular is covered and pt was given the basicular in March is that OK to switch from lantus to basicular. Anna request cb.

## 2016-11-04 NOTE — Progress Notes (Signed)
Pre visit review using our clinic review tool, if applicable. No additional management support is needed unless otherwise documented below in the visit note. 

## 2016-11-04 NOTE — Telephone Encounter (Signed)
basaglar sent in.  

## 2016-11-09 LAB — FRUCTOSAMINE: FRUCTOSAMINE: 481 umol/L — AB (ref 190–270)

## 2016-11-14 ENCOUNTER — Encounter: Payer: Self-pay | Admitting: *Deleted

## 2016-11-24 ENCOUNTER — Other Ambulatory Visit: Payer: Self-pay | Admitting: Family Medicine

## 2016-11-24 ENCOUNTER — Telehealth: Payer: Self-pay | Admitting: Family Medicine

## 2016-11-24 NOTE — Telephone Encounter (Signed)
fmla paperwork in dr g IN BOX

## 2016-11-24 NOTE — Telephone Encounter (Signed)
Is it okay to fill Rx, I don't see a meter on med list, and I don't see that we have filled test strips before, please advise

## 2016-11-25 DIAGNOSIS — Z7689 Persons encountering health services in other specified circumstances: Secondary | ICD-10-CM

## 2016-11-25 NOTE — Telephone Encounter (Signed)
Filled and in my outbox

## 2016-11-25 NOTE — Telephone Encounter (Signed)
Paperwork faxed °

## 2016-11-28 NOTE — Telephone Encounter (Signed)
Pt aware Copy for pt Copy for scan Copy for file Copy for billing

## 2016-12-06 ENCOUNTER — Other Ambulatory Visit: Payer: Commercial Managed Care - HMO

## 2016-12-13 ENCOUNTER — Ambulatory Visit: Payer: Self-pay | Admitting: Family Medicine

## 2017-01-05 ENCOUNTER — Ambulatory Visit: Payer: Self-pay | Admitting: Family Medicine

## 2017-01-05 DIAGNOSIS — Z0289 Encounter for other administrative examinations: Secondary | ICD-10-CM

## 2017-01-20 ENCOUNTER — Other Ambulatory Visit: Payer: Self-pay | Admitting: Student

## 2017-02-10 ENCOUNTER — Ambulatory Visit: Payer: Self-pay | Admitting: Family Medicine

## 2017-02-10 DIAGNOSIS — Z0289 Encounter for other administrative examinations: Secondary | ICD-10-CM

## 2017-03-12 ENCOUNTER — Other Ambulatory Visit: Payer: Self-pay | Admitting: Family Medicine

## 2017-03-13 NOTE — Telephone Encounter (Signed)
Medication is no longer on pts current med list. Last OV 10/2016.  pls advise.

## 2017-03-16 NOTE — Telephone Encounter (Signed)
This medicine is tribenzor - it was on list. Refilled. Thanks.

## 2017-03-18 ENCOUNTER — Other Ambulatory Visit: Payer: Self-pay | Admitting: Family Medicine

## 2017-04-13 ENCOUNTER — Other Ambulatory Visit: Payer: Self-pay | Admitting: Family Medicine

## 2017-04-24 ENCOUNTER — Other Ambulatory Visit: Payer: Self-pay | Admitting: Family Medicine

## 2017-04-25 ENCOUNTER — Other Ambulatory Visit: Payer: Self-pay | Admitting: Student

## 2017-05-05 NOTE — Telephone Encounter (Signed)
His colchicine was prescribed for acute pericarditis at that time. No refill needed. If he has a recurrent symptoms advised to follow-up with PCP or primary cardiologist.

## 2017-06-07 ENCOUNTER — Telehealth: Payer: Self-pay

## 2017-06-07 NOTE — Telephone Encounter (Signed)
Jonathan Compton from [facility] called pt had been to diabetic classes and pt has not f/u on A1C recheck; pt last seen 11/04/16 and was to return in 11/2016 for CPX. Pt has cancelled one appt and no showed 2 appts since then. I spoke with pt to schedule CPX for pt and pt said his schedule is really busy now and he will cb to schedule appt for CPX. I advised the importance of f/u for diabetes and offered to schedule appt whenever pt could come in; pt would have to ck his schedule and he will cb. FYI to Dr Sharen HonesGutierrez.

## 2017-08-10 ENCOUNTER — Ambulatory Visit: Payer: 59 | Admitting: Cardiovascular Disease

## 2017-08-10 ENCOUNTER — Encounter: Payer: Self-pay | Admitting: Cardiovascular Disease

## 2017-08-10 VITALS — BP 154/90 | HR 83 | Ht 75.0 in | Wt 336.4 lb

## 2017-08-10 DIAGNOSIS — I10 Essential (primary) hypertension: Secondary | ICD-10-CM | POA: Diagnosis not present

## 2017-08-10 MED ORDER — OLMESARTAN-AMLODIPINE-HCTZ 40-10-25 MG PO TABS: 1.0000 | ORAL_TABLET | Freq: Every day | ORAL | 11 refills | Status: DC
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
## 2017-08-10 NOTE — Patient Instructions (Signed)
Medication Instructions:  1) INCREASE Olmesartan-amlodipine-HCTZ to 40-10-25mg  daily. You have been given a prescription of amlodipine 5 mg to add to your current dose until you run out and get the new medication.  Labwork: None  Testing/Procedures: None  Follow-Up: Your provider wants you to follow-up in: 1 year with Dr. Excell Seltzerooper. You will receive a reminder letter in the mail two months in advance. If you don't receive a letter, please call our office to schedule the follow-up appointment.    Any Other Special Instructions Will Be Listed Below (If Applicable).     If you need a refill on your cardiac medications before your next appointment, please call your pharmacy.

## 2017-09-13 ENCOUNTER — Other Ambulatory Visit: Payer: Self-pay | Admitting: Family Medicine

## 2017-09-13 ENCOUNTER — Other Ambulatory Visit: Payer: Self-pay

## 2017-09-13 DIAGNOSIS — E118 Type 2 diabetes mellitus with unspecified complications: Principal | ICD-10-CM

## 2017-09-13 DIAGNOSIS — E1165 Type 2 diabetes mellitus with hyperglycemia: Secondary | ICD-10-CM

## 2017-09-13 DIAGNOSIS — I10 Essential (primary) hypertension: Secondary | ICD-10-CM

## 2017-09-13 DIAGNOSIS — IMO0002 Reserved for concepts with insufficient information to code with codable children: Secondary | ICD-10-CM

## 2017-09-15 ENCOUNTER — Encounter: Payer: Self-pay | Admitting: Family Medicine

## 2017-09-19 ENCOUNTER — Encounter: Payer: 59 | Admitting: Family Medicine

## 2017-09-19 DIAGNOSIS — Z0289 Encounter for other administrative examinations: Secondary | ICD-10-CM

## 2017-10-07 ENCOUNTER — Other Ambulatory Visit: Payer: Self-pay | Admitting: Cardiovascular Disease

## 2017-10-19 ENCOUNTER — Encounter (HOSPITAL_COMMUNITY): Payer: Self-pay | Admitting: Emergency Medicine

## 2017-10-19 ENCOUNTER — Other Ambulatory Visit: Payer: Self-pay | Admitting: Family Medicine

## 2017-10-19 ENCOUNTER — Ambulatory Visit (HOSPITAL_COMMUNITY)
Admission: EM | Admit: 2017-10-19 | Discharge: 2017-10-19 | Disposition: A | Discharge status: Home / Self Care | Payer: Managed Care, Other (non HMO) | Attending: Family | Admitting: Family

## 2017-10-19 ENCOUNTER — Ambulatory Visit (INDEPENDENT_AMBULATORY_CARE_PROVIDER_SITE_OTHER): Payer: Managed Care, Other (non HMO)

## 2017-10-19 ENCOUNTER — Other Ambulatory Visit: Payer: Self-pay

## 2017-10-19 DIAGNOSIS — J40 Bronchitis, not specified as acute or chronic: Secondary | ICD-10-CM | POA: Diagnosis not present

## 2017-10-19 DIAGNOSIS — J0100 Acute maxillary sinusitis, unspecified: Secondary | ICD-10-CM | POA: Diagnosis not present

## 2017-10-19 DIAGNOSIS — R062 Wheezing: Secondary | ICD-10-CM

## 2017-10-19 MED ORDER — ALBUTEROL SULFATE HFA 108 (90 BASE) MCG/ACT IN AERS: 2.0000 | INHALATION_SPRAY | Freq: Four times a day (QID) | RESPIRATORY_TRACT | 0 refills | Status: DC | PRN

## 2017-10-19 MED ORDER — IPRATROPIUM-ALBUTEROL 0.5-2.5 (3) MG/3ML IN SOLN
RESPIRATORY_TRACT | Status: AC
  Filled 2017-10-19: qty 3

## 2017-10-19 MED ORDER — IPRATROPIUM-ALBUTEROL 0.5-2.5 (3) MG/3ML IN SOLN
3.0000 mL | Freq: Once | RESPIRATORY_TRACT | Status: AC
  Administered 2017-10-19: 3 mL via RESPIRATORY_TRACT

## 2017-10-19 MED ORDER — PREDNISONE 20 MG PO TABS: 40.0000 mg | ORAL_TABLET | Freq: Every day | ORAL | 0 refills | Status: AC

## 2017-10-19 MED ORDER — AMOXICILLIN-POT CLAVULANATE 875-125 MG PO TABS: 1.0000 | ORAL_TABLET | Freq: Two times a day (BID) | ORAL | 0 refills | Status: AC

## 2017-10-19 NOTE — ED Triage Notes (Signed)
Onset of symptoms on Monday.  [facility] to [facility] Monday afternoon and told he was fine.  He has worsened since onset of symptoms.  Chest soreness, cough, runny nose.  Patient has a history of pneumonia and concerned for reoccurrence of pneumonia.  Patient states he wants an xray
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
## 2017-10-19 NOTE — Discharge Instructions (Addendum)
Recommend start Augmentin 875mg  twice a day as directed. Start Prednisone 40mg  daily for 5 days. Recommend use Albuterol inhaler 2 puffs every 6 hours as needed for wheezing. Continue Tessalon cough pills (from [facility]) every 8 hours as needed for cough. Increase fluid intake to help loosen up mucus. Follow-up with your PCP in 3-4 days for recheck or go to the ER if symptoms persist within 2 days or ASAP if worsen.

## 2017-10-31 ENCOUNTER — Other Ambulatory Visit: Payer: Self-pay | Admitting: Family Medicine

## 2017-11-06 ENCOUNTER — Ambulatory Visit: Payer: Managed Care, Other (non HMO) | Admitting: Family Medicine

## 2017-11-06 ENCOUNTER — Encounter: Payer: Self-pay | Admitting: Family Medicine

## 2017-11-06 VITALS — BP 136/82 | HR 90 | Temp 98.2°F | Ht 75.0 in | Wt 331.0 lb

## 2017-11-06 DIAGNOSIS — I10 Essential (primary) hypertension: Secondary | ICD-10-CM

## 2017-11-06 DIAGNOSIS — R0683 Snoring: Secondary | ICD-10-CM | POA: Diagnosis not present

## 2017-11-06 DIAGNOSIS — Z6841 Body Mass Index (BMI) 40.0 and over, adult: Secondary | ICD-10-CM

## 2017-11-06 DIAGNOSIS — E785 Hyperlipidemia, unspecified: Secondary | ICD-10-CM

## 2017-11-06 DIAGNOSIS — Z794 Long term (current) use of insulin: Secondary | ICD-10-CM

## 2017-11-06 DIAGNOSIS — Z23 Encounter for immunization: Secondary | ICD-10-CM

## 2017-11-06 DIAGNOSIS — E1169 Type 2 diabetes mellitus with other specified complication: Secondary | ICD-10-CM | POA: Diagnosis not present

## 2017-11-06 NOTE — Progress Notes (Signed)
BP 136/82 (BP Location: Left Arm, Patient Position: Sitting, Cuff Size: Large)   Pulse 90   Temp 98.2 F (36.8 C) (Oral)   Ht 6\' 3"  (1.905 m)   Wt (!) 331 lb (150.1 kg)   SpO2 97%   BMI 41.37 kg/m    CC: DM form completion Subjective:    Patient ID: Jonathan Compton, male    DOB: 1970-08-01, 47 y.o.   MRN: [id number]  HPI: Jonathan Compton is a 47 y.o. 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 Here today because he would like DOT DM forms filled out. Had DOT physical on Saturday at [facility] - A1c was 5.8% (11/04/2017). Needs OSA eval and DM form to pass for sleep study.  Requests sleep study referral.  His mother Jonathan Compton passed away 09-12-17.  Now working for city of [employer].   4 hours fasting.   DM - does regularly check sugars fasting in the morning, brings glucometer with sugar log which was reviewed (recently 120-130 fasting, some as high as 200, some as low as 70s). Compliant with antihyperglycemic regimen which includes: basaglar 30u QAM, metformin 500mg  bid. Denies low sugars or hypoglycemic symptoms. Denies paresthesias. Last diabetic eye exam DUE. Pneumovax: DUE. Prevnar: not due. Glucometer brand: onetouch verio. DSME: [facility] 09/2015. Foot exam: DUE.  Lab Results  Component Value Date   HGBA1C 12.3 (H) 10/05/2016   Diabetic Foot Exam - Simple   Simple Foot Form Diabetic Foot exam was performed with the following findings:  Yes 11/06/2017  5:03 PM  Visual Inspection No deformities, no ulcerations, no other skin breakdown bilaterally:  Yes Sensation Testing Intact to touch and monofilament testing bilaterally:  Yes Pulse Check Posterior Tibialis and Dorsalis pulse intact bilaterally:  Yes Comments    No results found for: MICROALBUR, [id number]    HTN - compliant with olmesartan amlodipine hctz 40/10/25 daily.   Relevant past medical, surgical, family and social history reviewed  and updated as indicated. Interim medical history since our last visit reviewed. Allergies and medications reviewed and updated. Outpatient Medications Prior to Visit  Medication Sig Dispense Refill  . albuterol (PROVENTIL HFA;VENTOLIN HFA) 108 (90 Base) MCG/ACT inhaler Inhale 2 puffs into the lungs every 6 (six) hours as needed for wheezing or shortness of breath. 1 Inhaler 0  . B-D UF III MINI PEN NEEDLES 31G X 5 MM MISC USE TID AND PRN AS DIRECTED E11.8 100 each 0  . Insulin Glargine (BASAGLAR KWIKPEN) 100 UNIT/ML SOPN Inject 0.3 mLs (30 Units total) into the skin at bedtime. 3 pen   . metFORMIN (GLUCOPHAGE) 500 MG tablet TAKE 1 TABLET (500 MG TOTAL) BY MOUTH 2 (TWO) TIMES DAILY WITH A MEAL. 60 tablet 0  . Olmesartan-Amlodipine-HCTZ 40-10-25 MG TABS Take 1 tablet by mouth daily. 30 tablet 11  . ONETOUCH DELICA LANCETS FINE MISC USE TO CHECK BLOOD SUGAR TWICE DAILY AND AS NEEDED (DX. E11.8 & E11.65) 100 each 0  . ONETOUCH VERIO test strip USE TO CHECK BLOOD SUGAR TWICE DAILY AND AS NEEDED (DX. E11.8 & E11.65) 100 each 1  . ranitidine (ZANTAC) 150 MG tablet Take 150 mg by mouth 2 (two) times daily.    . Insulin Glargine (BASAGLAR KWIKPEN) 100 UNIT/ML SOPN Inject 0.24-0.3 mLs (24-30 Units total) into the skin at bedtime. Tapering dose 3 pen 11   Facility-Administered Medications Prior to Visit  Medication Dose Route Frequency  Provider Last Rate Last Dose  . 0.9 %  sodium chloride infusion    Continuous PRN Tonny Bollman, MD 100 mL/hr at 10/05/16 1017 100 mL/hr at 10/05/16 1017  . fentaNYL (SUBLIMAZE) injection    PRN Tonny Bollman, MD   25 mcg at 10/05/16 1018  . heparin infusion 2 units/mL in 0.9 % sodium chloride    Continuous PRN Tonny Bollman, MD   1,000 mL at 10/05/16 1043  . heparin injection    PRN Tonny Bollman, MD   5,000 Units at 10/05/16 1023  . iopamidol (ISOVUE-370) 76 % injection    PRN Tonny Bollman, MD   90 mL at 10/05/16 1043  . lidocaine (PF) (XYLOCAINE) 1 % injection     PRN Tonny Bollman, MD   2 mL at 10/05/16 1020  . midazolam (VERSED) injection    PRN Tonny Bollman, MD   1 mg at 10/05/16 1018  . Radial Cocktail/Verapamil only    PRN Tonny Bollman, MD   10 mL at 10/05/16 1022     Per HPI unless specifically indicated in ROS section below Review of Systems     Objective:    BP 136/82 (BP Location: Left Arm, Patient Position: Sitting, Cuff Size: Large)   Pulse 90   Temp 98.2 F (36.8 C) (Oral)   Ht 6\' 3"  (1.905 m)   Wt (!) 331 lb (150.1 kg)   SpO2 97%   BMI 41.37 kg/m   Wt Readings from Last 3 Encounters:  11/06/17 (!) 331 lb (150.1 kg)  08/10/17 (!) 336 lb 6.4 oz (152.6 kg)  11/04/16 (!) 308 lb 12 oz (140 kg)    Physical Exam  Constitutional: He appears well-developed and well-nourished. No distress.  HENT:  Head: Normocephalic and atraumatic.  Right Ear: External ear normal.  Left Ear: External ear normal.  Nose: Nose normal.  Mouth/Throat: Oropharynx is clear and moist. No oropharyngeal exudate.  Eyes: Pupils are equal, round, and reactive to light. Conjunctivae and EOM are normal. No scleral icterus.  Neck: Normal range of motion. Neck supple.  Cardiovascular: Normal rate, regular rhythm, normal heart sounds and intact distal pulses.  No murmur heard. Pulmonary/Chest: Effort normal and breath sounds normal. No respiratory distress. He has no wheezes. He has no rales.  Musculoskeletal: He exhibits no edema.  See HPI for foot exam if done  Lymphadenopathy:    He has no cervical adenopathy.  Skin: Skin is warm and dry. No rash noted.  Psychiatric: He has a normal mood and affect.  Nursing note and vitals reviewed.  Results for orders placed or performed in visit on 11/04/16  Fructosamine  Result Value Ref Range   Fructosamine 481 (H) 190 - 270 umol/L   Lab Results  Component Value Date   CHOL 141 10/05/2016   HDL 25 (L) 10/05/2016   LDLCALC 65 10/05/2016   LDLDIRECT 72.0 09/12/2016   TRIG 253 (H) 10/05/2016    CHOLHDL 5.6 10/05/2016       Assessment & Plan:   Problem List Items Addressed This Visit    BMI 40.0-44.9, adult (HCC)    Discussed weight gain noted. Encouraged incorporating regular exercise into routine, work towards diabetic diet.       Relevant Medications   Insulin Glargine (BASAGLAR KWIKPEN) 100 UNIT/ML SOPN   Dyslipidemia    Update FLP.       Hypertension    Chronic, stable. Continue current regimen       Relevant Orders   Lipid panel  Comprehensive metabolic panel   Snores    Screened positive at recent DOT physical with STOP BANG questionairre. High risk for OSA - will refer for sleep eval.       Relevant Orders   Ambulatory referral to Pulmonology   Type 2 diabetes mellitus with other specified complication (HCC) - Primary    Great control based on recent A1c at DOT physical. I will update labs and fill out DOT form pt requests today.  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 He agrees with plan.       Relevant Medications   Insulin Glargine (BASAGLAR KWIKPEN) 100 UNIT/ML SOPN   Other Relevant Orders   Lipid panel   Comprehensive metabolic panel   Hemoglobin A1c       No orders of the defined types were placed in this encounter.  Orders Placed This Encounter  Procedures  . Lipid panel  . Comprehensive metabolic panel  . Hemoglobin A1c  . Ambulatory referral to Pulmonology    Referral Priority:   Routine    Referral Type:   Consultation    Referral Reason:   Specialty Services Required    Requested Specialty:   Pulmonary Disease    Number of Visits Requested:   1    Follow up plan: Return in about 6 months (around 05/08/2018) for annual exam, prior fasting for blood work.  Eustaquio BoydenJavier Lovelle Lema, MD

## 2017-11-06 NOTE — Patient Instructions (Addendum)
See Shirlee LimerickMarion on your way out for sleep doctor referral.  Pneumovax today.  Labs today.  Work on incorporating exercise into routine. Work on healthy low carb low sugar diet.  Schedule eye exam or get me report to update your chart.

## 2017-11-06 NOTE — Assessment & Plan Note (Signed)
Great control based on recent A1c at DOT physical. I will update labs and fill out DOT form pt requests today.  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 He agrees with plan.

## 2017-11-06 NOTE — Assessment & Plan Note (Signed)
Screened positive at recent DOT physical with STOP BANG questionairre. High risk for OSA - will refer for sleep eval.

## 2017-11-06 NOTE — Assessment & Plan Note (Signed)
Update FLP.  

## 2017-11-06 NOTE — Assessment & Plan Note (Signed)
Chronic, stable. Continue current regimen. 

## 2017-11-06 NOTE — Addendum Note (Signed)
Addended by: Nanci PinaGOINS, Zoeya Gramajo on: 11/06/2017 05:13 PM   Modules accepted: Orders

## 2017-11-06 NOTE — Assessment & Plan Note (Signed)
Discussed weight gain noted. Encouraged incorporating regular exercise into routine, work towards diabetic diet.

## 2017-11-07 ENCOUNTER — Telehealth: Payer: Self-pay

## 2017-11-07 LAB — COMPREHENSIVE METABOLIC PANEL
ALK PHOS: 86 U/L (ref 39–117)
ALT: 18 U/L (ref 0–53)
AST: 21 U/L (ref 0–37)
Albumin: 4.1 g/dL (ref 3.5–5.2)
BUN: 15 mg/dL (ref 6–23)
CALCIUM: 9.5 mg/dL (ref 8.4–10.5)
CO2: 31 meq/L (ref 19–32)
Chloride: 101 mEq/L (ref 96–112)
Creatinine, Ser: 0.98 mg/dL (ref 0.40–1.50)
GFR: 105.17 mL/min (ref >60.00)
GLUCOSE: 94 mg/dL (ref 70–99)
Potassium: 4.2 mEq/L (ref 3.5–5.1)
Sodium: 139 mEq/L (ref 135–145)
Total Bilirubin: 0.6 mg/dL (ref 0.2–1.2)
Total Protein: 7.2 g/dL (ref 6.0–8.3)

## 2017-11-07 LAB — LIPID PANEL
CHOL/HDL RATIO: 6
Cholesterol: 152 mg/dL (ref 0–200)
HDL: 26.4 mg/dL — AB (ref >39.00)
NONHDL: 125.17
TRIGLYCERIDES: 310 mg/dL — AB (ref 0.0–149.0)
VLDL: 62 mg/dL — ABNORMAL HIGH (ref 0.0–40.0)

## 2017-11-07 LAB — LDL CHOLESTEROL, DIRECT: LDL DIRECT: 82 mg/dL

## 2017-11-07 LAB — HEMOGLOBIN A1C: Hgb A1c MFr Bld: 6 % (ref 4.6–6.5)

## 2017-11-07 NOTE — Telephone Encounter (Signed)
Patient wanted to try and get his DOT form this week before we close for holiday if possible. I advised patient I would send message back to the provider and also advised him that lab we are waiting on lab results still too. Patient understood-Jonathan Compton [name], RMA

## 2017-11-08 NOTE — Telephone Encounter (Signed)
Haven't received eye exam report yet but have filled out form and in Lisa's box. Ask pt if he'd like us to mail him form today.

## 2017-11-08 NOTE — Telephone Encounter (Signed)
Spoke with pt notifying pt his form is ready to pick up but Dr. Reece AgarG is still waiting for eye exam report.  Verbalizes understanding.  [Placed form at front office.]

## 2017-11-18 IMAGING — MR MR SHOULDER*R* W/O CM
4 of 5 series · 19 of 40 positions shown · non-contrast
Comparison: Radiographs dated 08/12/2015

CLINICAL DATA: Right shoulder pain with painful range of motion.

EXAM:
MRI OF THE RIGHT SHOULDER WITHOUT CONTRAST
TECHNIQUE: Multiplanar, multisequence MR imaging of the shoulder was performed.
No intravenous contrast was administered.

[Series 3: T2 fat-sat · axial · 4.0mm · 0.31mm/px · z∈[-3,+79]mm · 6 of 22 slices shown (1 of 3)]
[im 1/22]
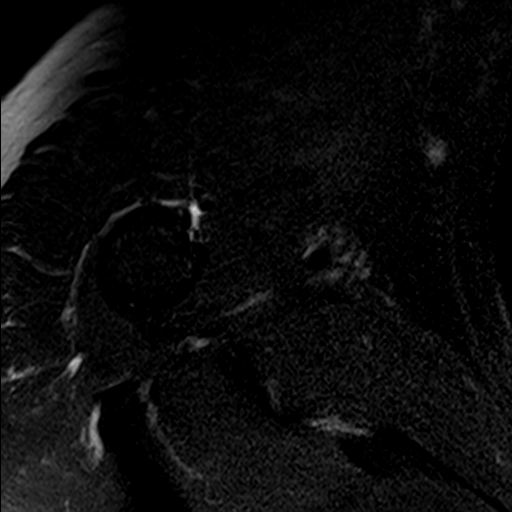
[im 3/22]
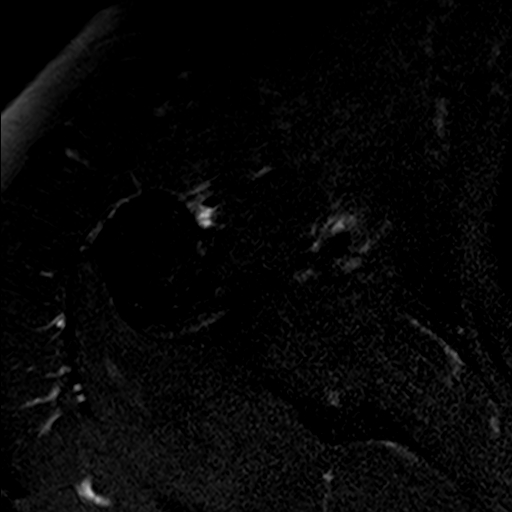
[im 8/22]
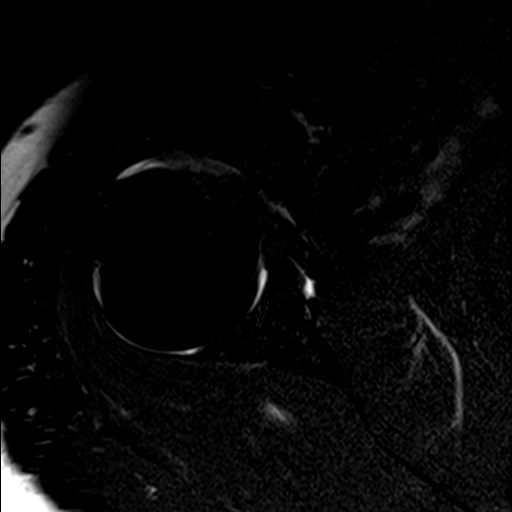
[im 10/22]
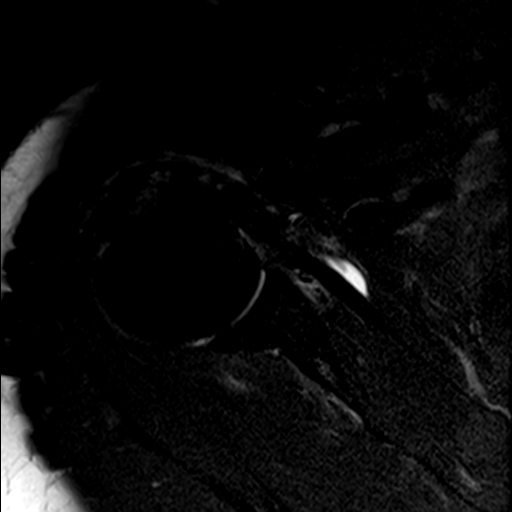
[im 12/22]
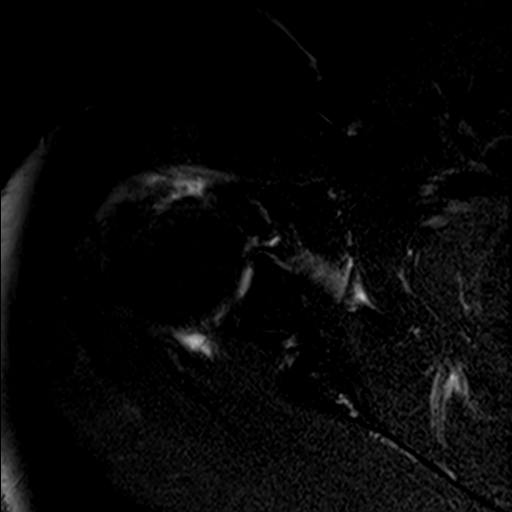
[im 19/22]
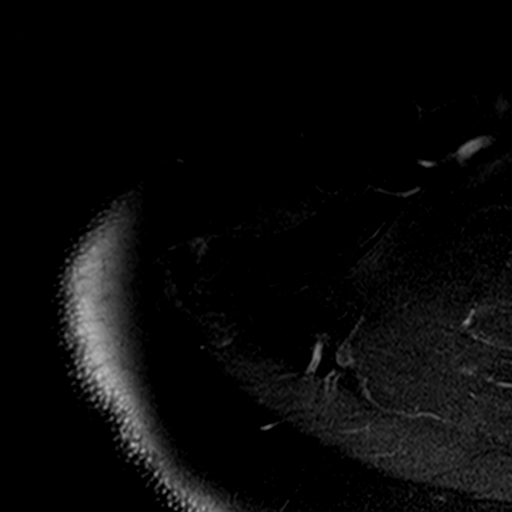

[Series 4: T2 fat-sat · oblique · 4.0mm · 0.29mm/px · 3 of 19 slices shown (2 of 3)]
[im 3/19]
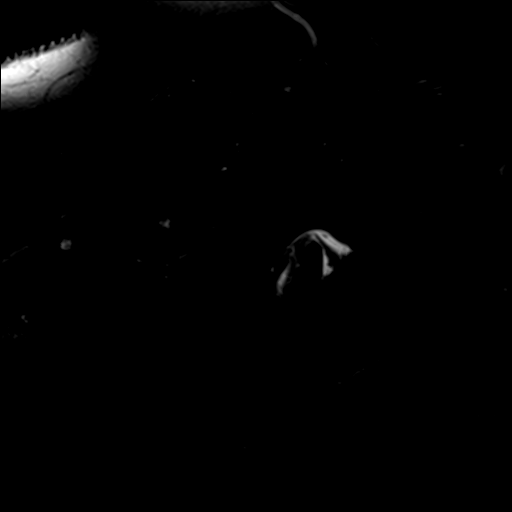
[im 11/19]
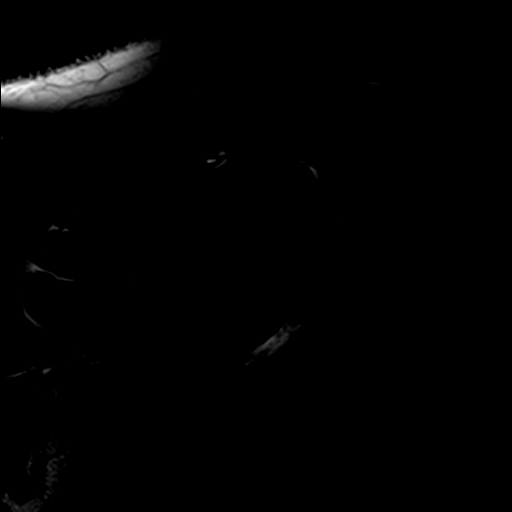
[im 16/19]
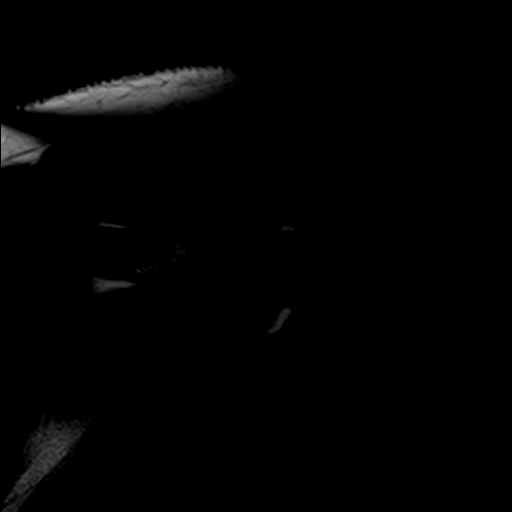

[Series 7: T2 fat-sat · oblique · 4.0mm · 0.29mm/px · 3 of 15 slices shown (3 of 3)]
[im 3/15]
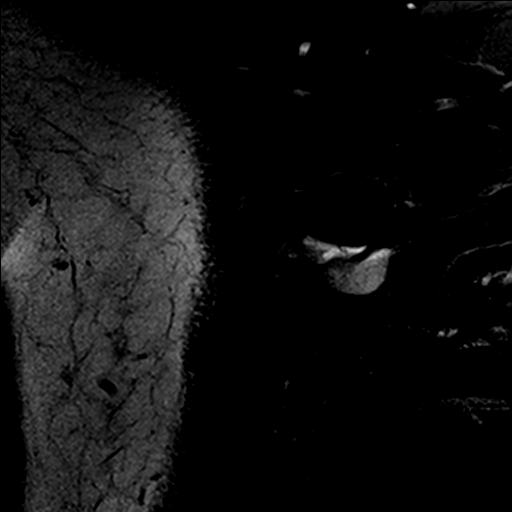
[im 8/15]
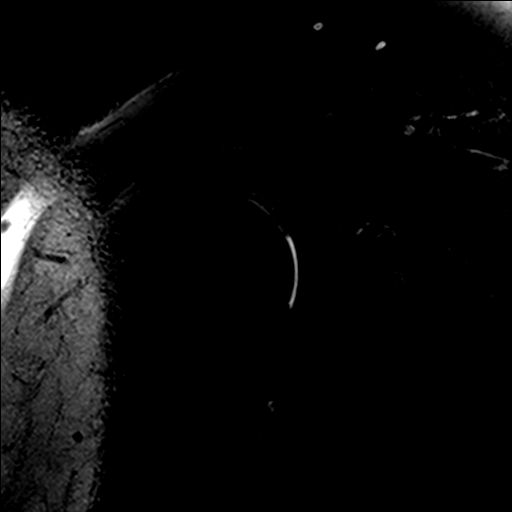
[im 12/15]
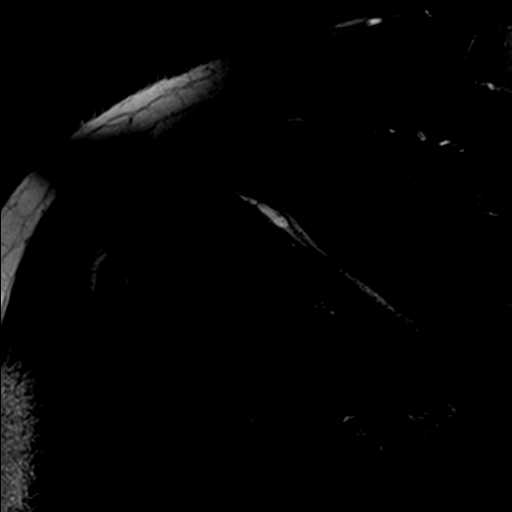

[Series 8: PD · oblique · 4.0mm · 0.29mm/px · 7 of 15 slices shown]
[im 1/15]
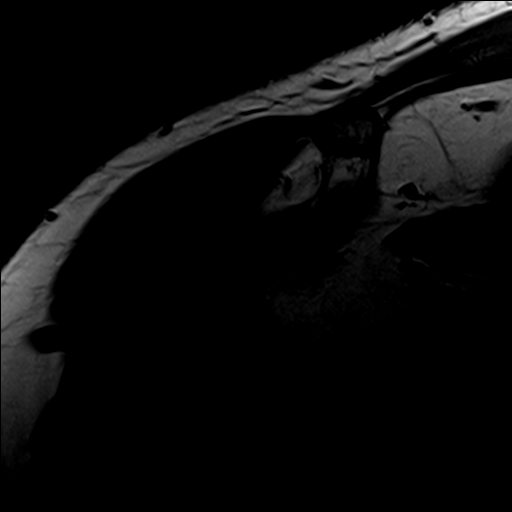
[im 3/15]
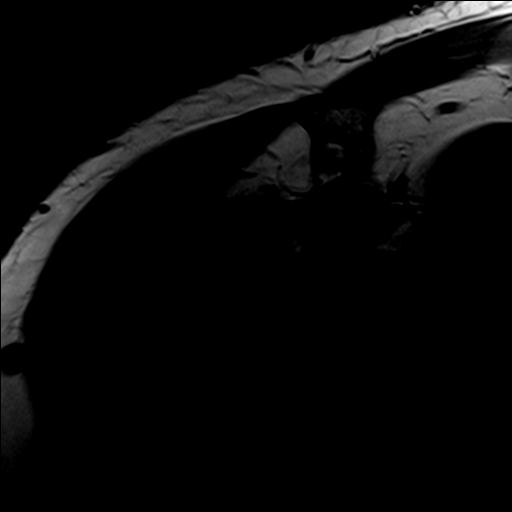
[im 5/15]
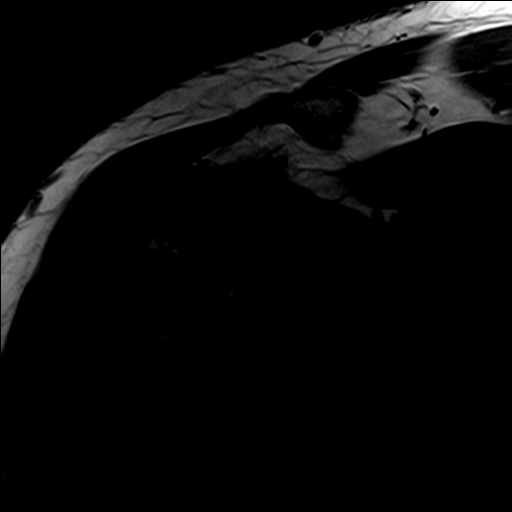
[im 8/15]
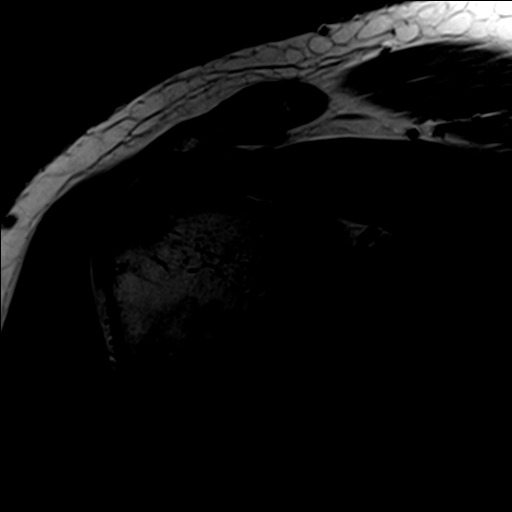
[im 10/15]
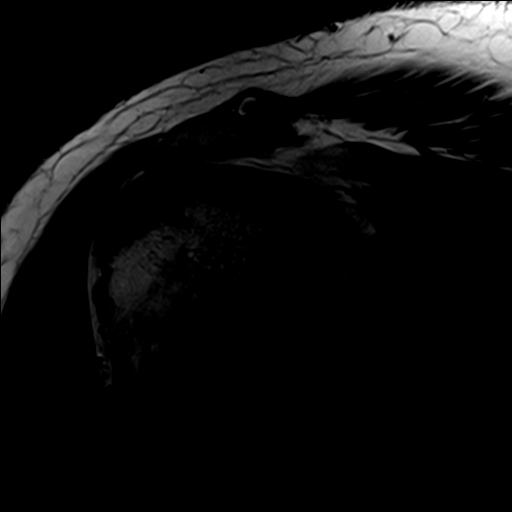
[im 12/15]
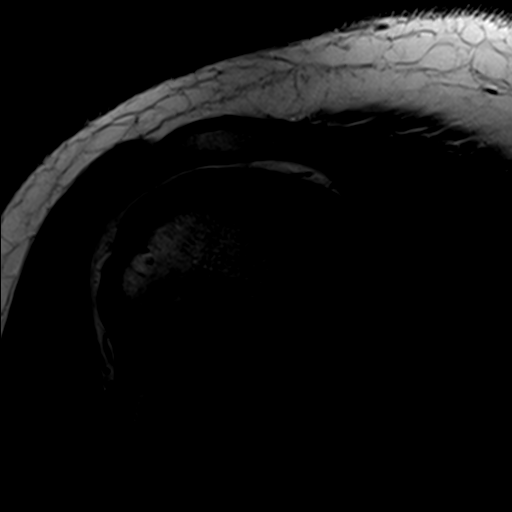
[im 15/15]
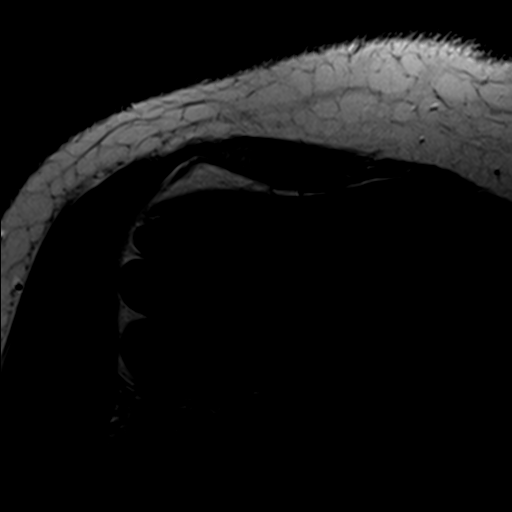

[19 of 40 positions shown; findings below may reference images not displayed]

FINDINGS: Rotator cuff: There is a transverse tear of distal supraspinatus
tendon without retraction. The tear is approximately 23 mm wide and
13 mm in length. There is a linear intrasubstance tear at the
musculotendinous junction of the infraspinatus.

Muscles:  No atrophy or edema.

Biceps long head:  Properly located and intact.

Acromioclavicular Joint:  Type 1 acromion.  Normal AC joint.

Glenohumeral Joint: Minimal joint effusion.

Labrum:  Normal.

Bones: Degenerative changes of the anterior aspect of the greater
tuberosity and lesser tuberosity.
IMPRESSION: 1. Full-thickness non retracted tear of the distal supraspinatus
tendon.
2. Intrasubstance tear of the musculotendinous junction of the
infraspinatus.

## 2017-11-20 ENCOUNTER — Telehealth: Payer: Self-pay

## 2017-11-20 NOTE — Telephone Encounter (Signed)
Amy Brian pharmacist from [facility] needs pt most recent A!C. Info given.  

## 2017-12-09 ENCOUNTER — Other Ambulatory Visit: Payer: Self-pay | Admitting: Family Medicine

## 2017-12-13 ENCOUNTER — Institutional Professional Consult (permissible substitution): Payer: Self-pay | Admitting: Pulmonary Disease

## 2017-12-19 ENCOUNTER — Other Ambulatory Visit: Payer: Self-pay | Admitting: Family Medicine

## 2018-01-29 ENCOUNTER — Encounter: Payer: Self-pay | Admitting: Pulmonary Disease

## 2018-01-29 ENCOUNTER — Ambulatory Visit (INDEPENDENT_AMBULATORY_CARE_PROVIDER_SITE_OTHER): Payer: PRIVATE HEALTH INSURANCE | Admitting: Pulmonary Disease

## 2018-01-29 VITALS — BP 124/86 | HR 99 | Ht 75.0 in | Wt 335.0 lb

## 2018-01-29 DIAGNOSIS — Z6841 Body Mass Index (BMI) 40.0 and over, adult: Secondary | ICD-10-CM | POA: Diagnosis not present

## 2018-01-29 DIAGNOSIS — R0683 Snoring: Secondary | ICD-10-CM

## 2018-01-29 NOTE — Progress Notes (Signed)
   Subjective:    Patient ID: Jonathan Compton, male    DOB: 30-Mar-1971, 47 y.o.   MRN: [id number]  HPI    Review of Systems  Constitutional: Negative for fever and unexpected weight change.  HENT: Negative for congestion, dental problem, ear pain, nosebleeds, postnasal drip, rhinorrhea, sinus pressure, sneezing, sore throat and trouble swallowing.   Eyes: Negative for redness and itching.  Respiratory: Negative for cough, chest tightness, shortness of breath and wheezing.   Cardiovascular: Negative for palpitations and leg swelling.  Gastrointestinal: Negative for nausea and vomiting.  Genitourinary: Negative for dysuria.  Musculoskeletal: Negative for joint swelling.  Skin: Negative for rash.  Allergic/Immunologic: Negative.  Negative for environmental allergies, food allergies and immunocompromised state.  Neurological: Negative for headaches.  Hematological: Does not bruise/bleed easily.  Psychiatric/Behavioral: Negative for dysphoric mood. The patient is not nervous/anxious.        Objective:   Physical Exam        Assessment & Plan:
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
## 2018-01-29 NOTE — Patient Instructions (Signed)
Will arrange for home sleep study Will call to arrange for follow up after sleep study reviewed  

## 2018-01-30 ENCOUNTER — Telehealth: Payer: Self-pay | Admitting: Pulmonary Disease

## 2018-01-31 NOTE — Telephone Encounter (Signed)
That is the phone # on file for pt so that is the # we will be calling.  Nothing further needed.

## 2018-02-08 ENCOUNTER — Telehealth: Payer: Self-pay | Admitting: Pulmonary Disease

## 2018-02-08 NOTE — Telephone Encounter (Signed)
Checked & pt's home sleep study is close to the top of our stack to call.  Left him a vm letting him know he should be called in the next few days & left him my # to call me back.

## 2018-02-09 NOTE — Telephone Encounter (Signed)
Advised pt that we will calling him hopefully by next week to schedule HST -pt voiced understanding -pr

## 2018-02-09 NOTE — Telephone Encounter (Signed)
I have called pt this morning & left him a vm to call me back to schedule hst.

## 2018-02-17 DIAGNOSIS — G4733 Obstructive sleep apnea (adult) (pediatric): Secondary | ICD-10-CM

## 2018-02-19 ENCOUNTER — Other Ambulatory Visit: Payer: Self-pay | Admitting: *Deleted

## 2018-02-19 DIAGNOSIS — R0683 Snoring: Secondary | ICD-10-CM

## 2018-02-20 ENCOUNTER — Telehealth: Payer: Self-pay | Admitting: Pulmonary Disease

## 2018-02-20 ENCOUNTER — Encounter: Payer: Self-pay | Admitting: Pulmonary Disease

## 2018-02-20 DIAGNOSIS — G4733 Obstructive sleep apnea (adult) (pediatric): Secondary | ICD-10-CM

## 2018-02-20 HISTORY — DX: Obstructive sleep apnea (adult) (pediatric): G47.33

## 2018-02-20 NOTE — Telephone Encounter (Signed)
HST 02/17/18 >> AHI 22.9, SaO2 low 70%.   Will have my nurse inform pt that sleep study shows moderate sleep apnea.  Options are 1) CPAP now, 2) ROV first.  If He is agreeable to CPAP, then please send order for auto CPAP range 5 to 15 cm H2O with heated humidity and mask of choice.  Have download sent 1 month after starting CPAP and set up ROV 2 months after starting CPAP.  ROV can be with me or NP.

## 2018-02-20 NOTE — Telephone Encounter (Signed)
Attempted to call patient today regarding results. I did not receive an answer at time of call. I have left a voicemail message for pt to return call. X1  

## 2018-02-21 NOTE — Telephone Encounter (Signed)
Spoke with pt. He is aware of his sleep study results. Order has been placed for CPAP therapy. Pt will call back for his 2 month ROV. Nothing further was needed.

## 2018-02-21 NOTE — Telephone Encounter (Signed)
Patient is returning call. CB is [phone]

## 2018-02-21 NOTE — Telephone Encounter (Signed)
Attempted to call pt. I did not receive an answer. I have left a message for pt to return our call.  

## 2018-02-21 NOTE — Telephone Encounter (Signed)
Patient returned phone call; patient at work around loud noise; pt contact # [phone]

## 2018-02-26 ENCOUNTER — Telehealth: Payer: Self-pay | Admitting: Pulmonary Disease

## 2018-02-26 NOTE — Telephone Encounter (Signed)
Pt. Called back again wanting to speak to a nurse. CB is [phone].

## 2018-02-26 NOTE — Telephone Encounter (Signed)
Spoke with pt. He is needing his sleep study faxed [facility]. This has been faxed. Nothing further was needed.

## 2018-02-27 ENCOUNTER — Telehealth: Payer: Self-pay | Admitting: Pulmonary Disease

## 2018-02-27 ENCOUNTER — Telehealth: Payer: Self-pay | Admitting: Pharmacy Technician

## 2018-02-27 NOTE — Telephone Encounter (Signed)
Called and spoke with [facility], they have been trying to contact the patient in regards to getting machine. Called and spoke with patient and advised him of this. Gave patient [facility] number so he can contact them. Patient verbalized understanding. Nothing further needed.

## 2018-02-28 NOTE — Telephone Encounter (Signed)
Encounter was opened in error. Closing message

## 2018-03-19 ENCOUNTER — Ambulatory Visit (INDEPENDENT_AMBULATORY_CARE_PROVIDER_SITE_OTHER): Payer: PRIVATE HEALTH INSURANCE | Admitting: Family Medicine

## 2018-03-19 ENCOUNTER — Encounter: Payer: Self-pay | Admitting: Family Medicine

## 2018-03-19 VITALS — BP 128/80 | HR 75 | Temp 97.8°F | Ht 75.0 in | Wt 334.8 lb

## 2018-03-19 DIAGNOSIS — Z794 Long term (current) use of insulin: Secondary | ICD-10-CM | POA: Diagnosis not present

## 2018-03-19 DIAGNOSIS — Z6841 Body Mass Index (BMI) 40.0 and over, adult: Secondary | ICD-10-CM

## 2018-03-19 DIAGNOSIS — I10 Essential (primary) hypertension: Secondary | ICD-10-CM

## 2018-03-19 DIAGNOSIS — G4733 Obstructive sleep apnea (adult) (pediatric): Secondary | ICD-10-CM | POA: Diagnosis not present

## 2018-03-19 DIAGNOSIS — E1169 Type 2 diabetes mellitus with other specified complication: Secondary | ICD-10-CM | POA: Diagnosis not present

## 2018-03-19 LAB — POCT GLYCOSYLATED HEMOGLOBIN (HGB A1C): Hemoglobin A1C: 5.8 % — AB (ref 4.0–5.6)

## 2018-03-19 NOTE — Assessment & Plan Note (Signed)
Encouraged healthy diet and lifestyle changes to affect sustainable weight loss. Pt motivated for weight loss - states he will stop sodas and start walking. Wants to lose at least 20 lbs by next physical, states he wants to drop to 300 lbs by next physical.

## 2018-03-19 NOTE — Progress Notes (Signed)
BP 128/80 (BP Location: Left Arm, Patient Position: Sitting, Cuff Size: Large)   Pulse 75   Temp 97.8 F (36.6 C) (Oral)   Ht 6\' 3"  (1.905 m)   Wt (!) 334 lb 12 oz (151.8 kg)   SpO2 96%   BMI 41.84 kg/m    CC: DM f/u  Subjective:    Patient ID: Jonathan Compton, male    DOB: Feb 05, 1971, 47 y.o.   MRN: [id number]  HPI: SELDON BARRELL is a 47 y.o. male presenting on 03/19/2018 for Diabetes (Here for A1c f/u. )   Obesity - pt motivated for weight loss, wants to lose at least 20 lbs by next physical, states he wants to drop to 300 lbs by next physical.   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   OSA - has started CPAP over last 3 wks - 5-15 mmHg.   DM - does regularly check sugars wonderful control 110 fasting. Compliant with antihyperglycemic regimen which includes: metformin 500mg  bid. Denies low sugars or hypoglycemic symptoms. Occasional L foot paresthesias. Last diabetic eye exam DUE. Pneumovax: 10/2017. Prevnar: not due yet. Glucometer brand: one-touch. DSME: through [facility] 09/2015. Off insulin for 5-6 months. No regular exercise.  Lab Results  Component Value Date   HGBA1C 5.8 (A) 03/19/2018   Diabetic Foot Exam - Simple   No data filed     No results found for: Concepcion Elk   Relevant past medical, surgical, family and social history reviewed and updated as indicated. Interim medical history since our last visit reviewed. Allergies and medications reviewed and updated. Outpatient Medications Prior to Visit  Medication Sig Dispense Refill  . albuterol (PROVENTIL HFA;VENTOLIN HFA) 108 (90 Base) MCG/ACT inhaler Inhale 2 puffs into the lungs every 6 (six) hours as needed for wheezing or shortness of breath. 1 Inhaler 0  . metFORMIN (GLUCOPHAGE) 500 MG tablet TAKE 1 TABLET (500 MG TOTAL) BY MOUTH 2 (TWO) TIMES DAILY WITH A MEAL. 60 tablet 5  . Olmesartan-Amlodipine-HCTZ 40-10-25 MG TABS Take 1 tablet by mouth daily. 30 tablet 11  . ONETOUCH  DELICA LANCETS FINE MISC USE TO CHECK BLOOD SUGAR TWICE DAILY AND AS NEEDED (DX. E11.8 & E11.65) 100 each 5  . ONETOUCH VERIO test strip USE TO CHECK BLOOD SUGAR TWICE DAILY AND AS NEEDED (DX. E11.8 & E11.65) 100 each 1  . ranitidine (ZANTAC) 150 MG tablet Take 150 mg by mouth 2 (two) times daily.    . B-D UF III MINI PEN NEEDLES 31G X 5 MM MISC USE 3 TIMES A DAY AND AS NEEDED AS DIRECTED E11.8 100 each 5  . Insulin Glargine (BASAGLAR KWIKPEN) 100 UNIT/ML SOPN Inject 0.3 mLs (30 Units total) into the skin at bedtime. 3 pen    Facility-Administered Medications Prior to Visit  Medication Dose Route Frequency Provider Last Rate Last Dose  . 0.9 %  sodium chloride infusion    Continuous PRN Tonny Bollman, MD 100 mL/hr at 10/05/16 1017 100 mL/hr at 10/05/16 1017  . fentaNYL (SUBLIMAZE) injection    PRN Tonny Bollman, MD   25 mcg at 10/05/16 1018  . heparin infusion 2 units/mL in 0.9 % sodium chloride    Continuous PRN Tonny Bollman, MD   1,000 mL at 10/05/16 1043  . heparin injection    PRN Tonny Bollman, MD   5,000 Units at 10/05/16 1023  . iopamidol (ISOVUE-370) 76 % injection    PRN Tonny Bollman, MD   90 mL at 10/05/16  1043  . lidocaine (PF) (XYLOCAINE) 1 % injection    PRN Tonny Bollmanooper, Michael, MD   2 mL at 10/05/16 1020  . midazolam (VERSED) injection    PRN Tonny Bollmanooper, Michael, MD   1 mg at 10/05/16 1018  . Radial Cocktail/Verapamil only    PRN Tonny Bollmanooper, Michael, MD   10 mL at 10/05/16 1022     Per HPI unless specifically indicated in ROS section below Review of Systems     Objective:    BP 128/80 (BP Location: Left Arm, Patient Position: Sitting, Cuff Size: Large)   Pulse 75   Temp 97.8 F (36.6 C) (Oral)   Ht 6\' 3"  (1.905 m)   Wt (!) 334 lb 12 oz (151.8 kg)   SpO2 96%   BMI 41.84 kg/m   Wt Readings from Last 3 Encounters:  03/19/18 (!) 334 lb 12 oz (151.8 kg)  01/29/18 (!) 335 lb (152 kg)  11/06/17 (!) 331 lb (150.1 kg)    Physical Exam  Constitutional: He appears  well-developed and well-nourished. No distress.  HENT:  Head: Normocephalic and atraumatic.  Right Ear: External ear normal.  Left Ear: External ear normal.  Nose: Nose normal.  Mouth/Throat: Oropharynx is clear and moist. No oropharyngeal exudate.  Eyes: Pupils are equal, round, and reactive to light. Conjunctivae and EOM are normal. No scleral icterus.  Neck: Normal range of motion. Neck supple.  Cardiovascular: Normal rate, regular rhythm, normal heart sounds and intact distal pulses.  No murmur heard. Pulmonary/Chest: Effort normal and breath sounds normal. No respiratory distress. He has no wheezes. He has no rales.  Musculoskeletal: He exhibits no edema.  See HPI for foot exam if done  Lymphadenopathy:    He has no cervical adenopathy.  Skin: Skin is warm and dry. No rash noted.  Psychiatric: He has a normal mood and affect.  Nursing note and vitals reviewed.  Results for orders placed or performed in visit on 03/19/18  POCT glycosylated hemoglobin (Hb A1C)  Result Value Ref Range   Hemoglobin A1C 5.8 (A) 4.0 - 5.6 %   HbA1c POC (<> result, manual entry)     HbA1c, POC (prediabetic range)     HbA1c, POC (controlled diabetic range)        Assessment & Plan:   Problem List Items Addressed This Visit    Type 2 diabetes mellitus with other specified complication (HCC) - Primary    Congratulated on ongoing wonderful control on metformin alone. Encouraged healthy diet and lifestyle changes for continued glycemic control. Recommended he schedule eye exam as due. Now off insulin, no longer needing this.  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       Relevant Orders   POCT glycosylated hemoglobin (Hb A1C) (Completed)   OSA (obstructive sleep apnea)    Has started using CPAP machine. Appreciate pulm care.       Hypertension    Chronic, stable. Continue current regimen.       BMI 40.0-44.9, adult (HCC)    Encouraged healthy diet and lifestyle changes to affect sustainable  weight loss. Pt motivated for weight loss - states he will stop sodas and start walking. Wants to lose at least 20 lbs by next physical, states he wants to drop to 300 lbs by next physical.           No orders of the defined types were placed in this encounter.  Orders Placed This Encounter  Procedures  . POCT glycosylated hemoglobin (Hb A1C)  Follow up plan: Return in about 6 months (around 09/19/2018) for annual exam, prior fasting for blood work.  Eustaquio Boyden, MD

## 2018-03-19 NOTE — Assessment & Plan Note (Signed)
Has started using CPAP machine. Appreciate pulm care.

## 2018-03-19 NOTE — Patient Instructions (Addendum)
Schedule eye exam as you're due.  You are doing great today! Congratulations on good sugar control!  Return in 4-6 months for physical.

## 2018-03-19 NOTE — Assessment & Plan Note (Signed)
Chronic, stable. Continue current regimen. 

## 2018-03-19 NOTE — Assessment & Plan Note (Addendum)
Congratulated on ongoing wonderful control on metformin alone. Encouraged healthy diet and lifestyle changes for continued glycemic control. Recommended he schedule eye exam as due. Now off insulin, no longer needing this.  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

## 2018-04-02 ENCOUNTER — Ambulatory Visit (INDEPENDENT_AMBULATORY_CARE_PROVIDER_SITE_OTHER): Payer: PRIVATE HEALTH INSURANCE | Admitting: Primary Care

## 2018-04-02 ENCOUNTER — Encounter: Payer: Self-pay | Admitting: Primary Care

## 2018-04-02 DIAGNOSIS — G4733 Obstructive sleep apnea (adult) (pediatric): Secondary | ICD-10-CM | POA: Diagnosis not present

## 2018-04-02 NOTE — Assessment & Plan Note (Addendum)
BP 148/88, recommend follow up with PCP Took medication this morning

## 2018-04-02 NOTE — Progress Notes (Signed)
Reviewed and agree with assessment/plan.   Mariacristina Aday, MD [facility] 07/20/2016, 12:24 PM Pager:  [phone]  

## 2018-04-02 NOTE — Assessment & Plan Note (Addendum)
-   HST AHI 22. Started on CPAP auto titrate 5-15cm H20 - Compliance is fairly good, 81%. AHI 1.7 - Complains of dry mouth, recommend adjusting humidification and if continued try chin strap  - Goal wear every night > 4-6 hours or more. Do not drive if experiencing excessive daytime fatigue or somnolence  - FU in 6 months to ensure continued compliance and if any issues arise, then annually

## 2018-04-02 NOTE — Patient Instructions (Addendum)
Goal is to wear CPAP every night for >4-6 hours or more  Do not drive if experiencing excessive daytime fatigue or somnolence   If still having dry mouth would recommend that you try chin strap   BP today 148/88, please contact PCP to follow up with reading   FU in 6 months with Dr. Craige Cotta and as needed
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
## 2018-04-03 ENCOUNTER — Other Ambulatory Visit: Payer: Self-pay | Admitting: Cardiovascular Disease

## 2018-04-03 MED ORDER — OLMESARTAN-AMLODIPINE-HCTZ 40-10-25 MG PO TABS: 1.0000 | ORAL_TABLET | Freq: Every day | ORAL | 0 refills | Status: DC

## 2018-04-05 ENCOUNTER — Other Ambulatory Visit: Payer: Self-pay

## 2018-04-05 MED ORDER — METFORMIN HCL 500 MG PO TABS: 500.0000 mg | ORAL_TABLET | Freq: Two times a day (BID) | ORAL | 1 refills | Status: DC

## 2018-04-05 NOTE — Telephone Encounter (Signed)
Escribed

## 2018-06-17 ENCOUNTER — Other Ambulatory Visit: Payer: Self-pay | Admitting: Cardiovascular Disease

## 2018-06-29 ENCOUNTER — Telehealth: Payer: Self-pay

## 2018-06-29 MED ORDER — FAMOTIDINE 20 MG PO TABS: 20.0000 mg | ORAL_TABLET | Freq: Two times a day (BID) | ORAL | 1 refills | Status: DC

## 2018-06-29 NOTE — Telephone Encounter (Signed)
pepcid sent to pharmacy. Stop ranitidine.

## 2018-06-29 NOTE — Telephone Encounter (Signed)
Pt has been taking ranitidine 150 mg (OTC) bid for couple of years. Pt said med has been recalled and pt request new rx for acid [facility]. Pt has flex pay card and will not pay unless rx is a prescription from PCP. Pt last seen 03/19/18. Pt request cb.

## 2018-06-29 NOTE — Telephone Encounter (Signed)
Left detailed message on voicemail.  

## 2018-07-10 ENCOUNTER — Other Ambulatory Visit: Payer: Self-pay | Admitting: *Deleted

## 2018-07-10 MED ORDER — FAMOTIDINE 20 MG PO TABS: 20.0000 mg | ORAL_TABLET | Freq: Two times a day (BID) | ORAL | 1 refills | Status: DC

## 2018-07-10 NOTE — Telephone Encounter (Signed)
Spoke to pt who states pepcid Rx has to be sent to local pharmacy; sent as requested

## 2018-08-28 ENCOUNTER — Other Ambulatory Visit: Payer: Self-pay | Admitting: Family Medicine

## 2018-08-29 ENCOUNTER — Other Ambulatory Visit: Payer: Self-pay | Admitting: Cardiovascular Disease

## 2018-09-19 ENCOUNTER — Encounter: Payer: Self-pay | Admitting: Family Medicine

## 2018-10-02 ENCOUNTER — Telehealth: Payer: Self-pay | Admitting: Pulmonary Disease

## 2018-10-02 DIAGNOSIS — G4733 Obstructive sleep apnea (adult) (pediatric): Secondary | ICD-10-CM

## 2018-10-02 NOTE — Telephone Encounter (Signed)
Pt is calling back [phone]

## 2018-10-02 NOTE — Telephone Encounter (Signed)
Called patient unable to reach left message to give us a call back.

## 2018-10-02 NOTE — Telephone Encounter (Signed)
Called patient unable to reach LMTCB 

## 2018-10-03 NOTE — Telephone Encounter (Signed)
Order has been placed for the change in cpap pressure. Nothing further needed.

## 2018-10-03 NOTE — Telephone Encounter (Signed)
Called and spoke with Patient.  Patient is a Dr Craige Cotta Patient last seen by Buelah Manis, NP, 03/2018, for OSA.  Patient stated that he feels like he is suffocating with his cpap at night. Patient stated that he wakes up gasping for air. Patient was wanting to know if a new prescription could be sent to [facility] for pressure change?   Message routed to Dr Craige Cotta, to advise

## 2018-10-03 NOTE — Telephone Encounter (Signed)
Can change auto CPAP to 5 - 20 cm H2O.

## 2018-11-22 ENCOUNTER — Other Ambulatory Visit: Payer: Self-pay | Admitting: Family Medicine

## 2018-11-22 ENCOUNTER — Encounter: Payer: Self-pay | Admitting: Family Medicine

## 2018-11-22 DIAGNOSIS — E785 Hyperlipidemia, unspecified: Secondary | ICD-10-CM

## 2018-11-22 DIAGNOSIS — I10 Essential (primary) hypertension: Secondary | ICD-10-CM

## 2018-11-22 DIAGNOSIS — E119 Type 2 diabetes mellitus without complications: Secondary | ICD-10-CM

## 2018-11-23 ENCOUNTER — Other Ambulatory Visit: Payer: Self-pay | Admitting: Cardiovascular Disease

## 2018-11-23 ENCOUNTER — Other Ambulatory Visit (INDEPENDENT_AMBULATORY_CARE_PROVIDER_SITE_OTHER): Payer: BLUE CROSS/BLUE SHIELD

## 2018-11-23 ENCOUNTER — Other Ambulatory Visit: Payer: Self-pay

## 2018-11-23 DIAGNOSIS — E785 Hyperlipidemia, unspecified: Secondary | ICD-10-CM

## 2018-11-23 DIAGNOSIS — E119 Type 2 diabetes mellitus without complications: Secondary | ICD-10-CM | POA: Diagnosis not present

## 2018-11-23 NOTE — Addendum Note (Signed)
Addended by: Alvina Chou on: 11/23/2018 02:54 PM   Modules accepted: Orders

## 2018-11-24 LAB — COMPREHENSIVE METABOLIC PANEL
AG Ratio: 1.5 (calc) (ref 1.0–2.5)
ALT: 17 U/L (ref 9–46)
AST: 21 U/L (ref 10–40)
Albumin: 4.2 g/dL (ref 3.6–5.1)
Alkaline phosphatase (APISO): 90 U/L (ref 36–130)
BUN: 15 mg/dL (ref 7–25)
CO2: 31 mmol/L (ref 20–32)
Calcium: 9.3 mg/dL (ref 8.6–10.3)
Chloride: 105 mmol/L (ref 98–110)
Creat: 1.07 mg/dL (ref 0.60–1.35)
Globulin: 2.8 g/dL (calc) (ref 1.9–3.7)
Glucose, Bld: 98 mg/dL (ref 65–99)
Potassium: 3.9 mmol/L (ref 3.5–5.3)
Sodium: 142 mmol/L (ref 135–146)
Total Bilirubin: 1 mg/dL (ref 0.2–1.2)
Total Protein: 7 g/dL (ref 6.1–8.1)

## 2018-11-24 LAB — LIPID PANEL
Cholesterol: 140 mg/dL (ref <200)
HDL: 27 mg/dL — ABNORMAL LOW (ref >=40)
LDL Cholesterol (Calc): 87 mg/dL (calc)
Non-HDL Cholesterol (Calc): 113 mg/dL (calc) (ref <130)
Total CHOL/HDL Ratio: 5.2 (calc) — ABNORMAL HIGH (ref <5.0)
Triglycerides: 159 mg/dL — ABNORMAL HIGH (ref <150)

## 2018-11-24 LAB — HEMOGLOBIN A1C
Hgb A1c MFr Bld: 5.6 % of total Hgb (ref <5.7)
Mean Plasma Glucose: 114 (calc)
eAG (mmol/L): 6.3 (calc)

## 2018-11-26 ENCOUNTER — Encounter: Payer: Self-pay | Admitting: Family Medicine

## 2018-11-26 ENCOUNTER — Ambulatory Visit (INDEPENDENT_AMBULATORY_CARE_PROVIDER_SITE_OTHER): Payer: BLUE CROSS/BLUE SHIELD | Admitting: Family Medicine

## 2018-11-26 VITALS — Ht 75.0 in

## 2018-11-26 DIAGNOSIS — Z6841 Body Mass Index (BMI) 40.0 and over, adult: Secondary | ICD-10-CM

## 2018-11-26 DIAGNOSIS — I10 Essential (primary) hypertension: Secondary | ICD-10-CM | POA: Diagnosis not present

## 2018-11-26 DIAGNOSIS — E119 Type 2 diabetes mellitus without complications: Secondary | ICD-10-CM | POA: Diagnosis not present

## 2018-11-26 DIAGNOSIS — E785 Hyperlipidemia, unspecified: Secondary | ICD-10-CM | POA: Diagnosis not present

## 2018-11-26 MED ORDER — BLOOD PRESSURE CUFF MISC: 0 refills | Status: AC

## 2018-11-26 NOTE — Assessment & Plan Note (Signed)
Requests Rx for BP cuff to buy with his health spending account or insurance.

## 2018-11-26 NOTE — Assessment & Plan Note (Signed)
Congratulated on healthy changes to date to keep A1c under good control. Continue metformin.

## 2018-11-26 NOTE — Assessment & Plan Note (Signed)
Continue to encourage healthy diet and lifestyle changes to affect sustainable weight loss.  

## 2018-11-26 NOTE — Progress Notes (Signed)
Virtual visit completed through [employer]. Due to national recommendations of social distancing due to COVID 19, a virtual visit is felt to be most appropriate for this patient at this time.   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    Ht 6\' 3"  (1.905 m)   BMI 41.75 kg/m   BP Readings from Last 3 Encounters:  04/02/18 (!) 148/88  03/19/18 128/80  01/29/18 124/86    CC: DM f/u visit Subjective:    Patient ID: Jonathan Compton, male    DOB: 1971-07-09, 48 y.o.   MRN: [id number]  HPI: [name] is a 48 y.o. male presenting on 11/26/2018 for Follow-up (DM and HTN f/u.)   Would like BP cuff Rx sent to local pharmacy.  Mailing address: [address] [address] [place] [address]  HTN - Compliant with current antihypertensive regimen of olmesartan amlodipine hctz daily. Does not check blood pressures at home. No low blood pressure readings or symptoms of dizziness/syncope. Denies HA, vision changes, CP/tightness, SOB, leg swelling.    DM - does check sugars. Compliant with antihyperglycemic regimen which includes: metformin 500mg  bid. Denies low sugars or hypoglycemic symptoms. Denies paresthesias. Last diabetic eye exam DUE. Pneumovax: 10/2017. Prevnar: not due. Glucometer brand: one-touch. DSME: [facility] 09/2015 Lab Results  Component Value Date   HGBA1C 5.6 11/23/2018   Diabetic Foot Exam - Simple   No data filed     No results found for: Concepcion Elk       Relevant past medical, surgical, family and social history reviewed and updated as indicated. Interim medical history since our last visit reviewed. Allergies and medications reviewed and updated. Outpatient Medications Prior to Visit  Medication Sig Dispense Refill  . albuterol (PROVENTIL HFA;VENTOLIN HFA) 108 (90 Base) MCG/ACT inhaler Inhale 2 puffs into the  lungs every 6 (six) hours as needed for wheezing or shortness of breath. 1 Inhaler 0  . famotidine (PEPCID) 20 MG tablet Take 1 tablet (20 mg total) by mouth 2 (two) times daily. 180 tablet 1  . metFORMIN (GLUCOPHAGE) 500 MG tablet TAKE 1 TABLET TWICE A DAY WITH MEALS 180 tablet 0  . Olmesartan-amLODIPine-HCTZ 40-10-25 MG TABS TAKE 1 TABLET BY MOUTH EVERY DAY 15 tablet 0  . ONETOUCH DELICA LANCETS FINE MISC USE TO CHECK BLOOD SUGAR TWICE DAILY AND AS NEEDED (DX. E11.8 & E11.65) 100 each 5  . ONETOUCH VERIO test strip USE TO CHECK BLOOD SUGAR TWICE DAILY AND AS NEEDED (DX. E11.8 & E11.65) 100 each 1   Facility-Administered Medications Prior to Visit  Medication Dose Route Frequency Provider Last Rate Last Dose  . 0.9 %  sodium chloride infusion    Continuous PRN Tonny Bollman, MD 100 mL/hr at 10/05/16 1017 100 mL/hr at 10/05/16 1017  . fentaNYL (SUBLIMAZE) injection    PRN Tonny Bollman, MD   25 mcg at 10/05/16 1018  . heparin infusion 2 units/mL in 0.9 % sodium chloride    Continuous PRN Tonny Bollman, MD   1,000 mL at 10/05/16 1043  . heparin injection    PRN Tonny Bollman, MD   5,000 Units at 10/05/16 1023  . iopamidol (ISOVUE-370) 76 % injection    PRN Tonny Bollman, MD   90 mL at 10/05/16 1043  . lidocaine (PF) (XYLOCAINE) 1 % injection    PRN Tonny Bollman, MD  2 mL at 10/05/16 1020  . midazolam (VERSED) injection    PRN Tonny Bollmanooper, Michael, MD   1 mg at 10/05/16 1018  . Radial Cocktail/Verapamil only    PRN Tonny Bollmanooper, Michael, MD   10 mL at 10/05/16 1022     Per HPI unless specifically indicated in ROS section below Review of Systems Objective:    Ht 6\' 3"  (1.905 m)   BMI 41.75 kg/m   Wt Readings from Last 3 Encounters:  04/02/18 (!) 334 lb (151.5 kg)  03/19/18 (!) 334 lb 12 oz (151.8 kg)  01/29/18 (!) 335 lb (152 kg)     Physical exam: Gen: alert, NAD, not ill appearing Pulm: speaks in complete sentences without increased work of breathing Psych: normal mood, normal  thought content      Results for orders placed or performed in visit on 11/23/18  Lipid panel  Result Value Ref Range   Cholesterol 140 <200 mg/dL   HDL 27 (L) > OR = 40 mg/dL   Triglycerides 147159 (H) <150 mg/dL   LDL Cholesterol (Calc) 87 mg/dL (calc)   Total CHOL/HDL Ratio 5.2 (H) <5.0 (calc)   Non-HDL Cholesterol (Calc) 113 <130 mg/dL (calc)  Comprehensive metabolic panel  Result Value Ref Range   Glucose, Bld 98 65 - 99 mg/dL   BUN 15 7 - 25 mg/dL   Creat 8.291.07 5.620.60 - 1.301.35 mg/dL   BUN/Creatinine Ratio NOT APPLICABLE 6 - 22 (calc)   Sodium 142 135 - 146 mmol/L   Potassium 3.9 3.5 - 5.3 mmol/L   Chloride 105 98 - 110 mmol/L   CO2 31 20 - 32 mmol/L   Calcium 9.3 8.6 - 10.3 mg/dL   Total Protein 7.0 6.1 - 8.1 g/dL   Albumin 4.2 3.6 - 5.1 g/dL   Globulin 2.8 1.9 - 3.7 g/dL (calc)   AG Ratio 1.5 1.0 - 2.5 (calc)   Total Bilirubin 1.0 0.2 - 1.2 mg/dL   Alkaline phosphatase (APISO) 90 36 - 130 U/L   AST 21 10 - 40 U/L   ALT 17 9 - 46 U/L  Hemoglobin A1c  Result Value Ref Range   Hgb A1c MFr Bld 5.6 <5.7 % of total Hgb   Mean Plasma Glucose 114 (calc)   eAG (mmol/L) 6.3 (calc)   Assessment & Plan:   Problem List Items Addressed This Visit    Hypertension    Requests Rx for BP cuff to buy with his health spending account or insurance.       Dyslipidemia    Marked improvement with better blood pressure control. Consider statin given diabetes history - will review at CPE.  The 10-year ASCVD risk score Denman George(Goff DC Montez HagemanJr., et al., 2013) is: 20.3%   Values used to calculate the score:     Age: [age] years     Sex: Male     Is Non-Hispanic African American: Yes     Diabetic: Yes     Tobacco smoker: No     Systolic Blood Pressure: 148 mmHg     Is BP treated: Yes     HDL Cholesterol: 27 mg/dL     Total Cholesterol: 140 mg/dL       Diabetes mellitus without complication (HCC) - Primary    Congratulated on healthy changes to date to keep A1c under good control. Continue metformin.        BMI 40.0-44.9, adult (HCC)    Continue to encourage healthy diet and lifestyle changes to affect sustainable weight loss.  Meds ordered this encounter  Medications  . Blood Pressure Monitoring (BLOOD PRESSURE CUFF) MISC    Sig: Use as directed to check blood pressures daily or as needed. XL cuff    Dispense:  1 each    Refill:  0   No orders of the defined types were placed in this encounter.   Follow up plan: Return in about 6 months (around 05/29/2019) for annual exam, prior fasting for blood work.  Eustaquio Boyden, MD

## 2018-11-26 NOTE — Assessment & Plan Note (Signed)
Marked improvement with better blood pressure control. Consider statin given diabetes history - will review at CPE.  The 10-year ASCVD risk score Denman George DC Montez Hageman., et al., 2013) is: 20.3%   Values used to calculate the score:     Age: 48 years     Sex: Male     Is Non-Hispanic African American: Yes     Diabetic: Yes     Tobacco smoker: No     Systolic Blood Pressure: 148 mmHg     Is BP treated: Yes     HDL Cholesterol: 27 mg/dL     Total Cholesterol: 140 mg/dL

## 2018-11-30 ENCOUNTER — Encounter: Payer: PRIVATE HEALTH INSURANCE | Admitting: Family Medicine

## 2018-12-07 ENCOUNTER — Telehealth: Payer: Self-pay | Admitting: Physician Assistant

## 2018-12-07 NOTE — Telephone Encounter (Signed)
Spoke with patient who confirmed all demographics. Patient has a smart phone and uses My Chart. Will have vitals ready for visit. °

## 2018-12-11 ENCOUNTER — Telehealth (INDEPENDENT_AMBULATORY_CARE_PROVIDER_SITE_OTHER): Payer: BLUE CROSS/BLUE SHIELD | Admitting: Physician Assistant

## 2018-12-11 ENCOUNTER — Encounter: Payer: Self-pay | Admitting: Physician Assistant

## 2018-12-11 ENCOUNTER — Other Ambulatory Visit: Payer: Self-pay

## 2018-12-11 ENCOUNTER — Telehealth: Payer: Self-pay | Admitting: *Deleted

## 2018-12-11 VITALS — Ht 75.0 in | Wt 320.0 lb

## 2018-12-11 DIAGNOSIS — R0789 Other chest pain: Secondary | ICD-10-CM

## 2018-12-11 DIAGNOSIS — Z7189 Other specified counseling: Secondary | ICD-10-CM

## 2018-12-11 DIAGNOSIS — I10 Essential (primary) hypertension: Secondary | ICD-10-CM

## 2018-12-11 DIAGNOSIS — G4733 Obstructive sleep apnea (adult) (pediatric): Secondary | ICD-10-CM

## 2018-12-11 MED ORDER — OLMESARTAN-AMLODIPINE-HCTZ 40-10-25 MG PO TABS: ORAL_TABLET | ORAL | 3 refills | Status: DC

## 2018-12-11 NOTE — Patient Instructions (Signed)
Medication Instructions:  Your physician recommends that you continue on your current medications as directed. Please refer to the Current Medication list given to you today. 1.Patients medication filled today at requested pharmacy  Labwork: none  Testing/Procedures: none  Follow-Up: Your physician wants you to follow-up in: 1 year with Dr Excell Seltzer. You will receive a reminder letter in the mail two months in advance. If you don't receive a letter, please call our office to schedule the follow-up appointment.   Any Other Special Instructions Will Be Listed Below (If Applicable).     If you need a refill on your cardiac medications before your next appointment, please call your pharmacy.
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
## 2018-12-11 NOTE — Telephone Encounter (Signed)
..   Virtual Visit Pre-Appointment Phone Call  "(Name), I am calling you today to discuss your upcoming appointment. We are currently trying to limit exposure to the virus that causes COVID-19 by seeing patients at home rather than in the office."  1. "What is the BEST phone number to call the day of the visit?" - include this in appointment notes  2. Do you have or have access to (through a family member/friend) a smartphone with video capability that we can use for your visit?" a. If yes - list this number in appt notes as cell (if different from BEST phone #) and list the appointment type as a VIDEO visit in appointment notes b. If no - list the appointment type as a PHONE visit in appointment notes  3. Confirm consent - "In the setting of the current Covid19 crisis, you are scheduled for a (phone or video) visit with your provider on (date) at (time).  Just as we do with many in-office visits, in order for you to participate in this visit, we must obtain consent.  If you'd like, I can send this to your mychart (if signed up) or email for you to review.  Otherwise, I can obtain your verbal consent now.  All virtual visits are billed to your insurance company just like a normal visit would be.  By agreeing to a virtual visit, we'd like you to understand that the technology does not allow for your provider to perform an examination, and thus may limit your provider's ability to fully assess your condition. If your provider identifies any concerns that need to be evaluated in person, we will make arrangements to do so.  Finally, though the technology is pretty good, we cannot assure that it will always work on either your or our end, and in the setting of a video visit, we may have to convert it to a phone-only visit.  In either situation, we cannot ensure that we have a secure connection.  Are you willing to proceed?" STAFF: Did the patient verbally acknowledge consent to telehealth visit? Document  YES/NO here: Yes  4. Advise patient to be prepared - "Two hours prior to your appointment, go ahead and check your blood pressure, pulse, oxygen saturation, and your weight (if you have the equipment to check those) and write them all down. When your visit starts, your provider will ask you for this information. If you have an Apple Watch or Kardia device, please plan to have heart rate information ready on the day of your appointment. Please have a pen and paper handy nearby the day of the visit as well."  5. Give patient instructions for MyChart download to smartphone OR Doximity/Doxy.me as below if video visit (depending on what platform provider is using)  6. Inform patient they will receive a phone call 15 minutes prior to their appointment time (may be from unknown caller ID) so they should be prepared to answer    TELEPHONE CALL NOTE  Jonathan Compton has been deemed a candidate for a follow-up tele-health visit to limit community exposure during the Covid-19 pandemic. I spoke with the patient via phone to ensure availability of phone/video source, confirm preferred email & phone number, and discuss instructions and expectations.  I reminded Jonathan Compton to be prepared with any vital sign and/or heart rhythm information that could potentially be obtained via home monitoring, at the time of his visit. I reminded Jonathan Compton to expect a phone call prior  to his visit.  Verneda Skillsley, Suraya Vidrine L, CMA 12/11/2018 10:18 AM   IF USING DOXIMITY or DOXY.ME - The patient will receive a link just prior to their visit by text.     FULL LENGTH CONSENT FOR TELE-HEALTH VISIT   I hereby voluntarily request, consent and authorize [facility] and its employed or contracted physicians, physician assistants, nurse practitioners or other licensed health care professionals (the Practitioner), to provide me with telemedicine health care services (the Services") as deemed necessary by the treating  Practitioner. I acknowledge and consent to receive the Services by the Practitioner via telemedicine. I understand that the telemedicine visit will involve communicating with the Practitioner through live audiovisual communication technology and the disclosure of certain medical information by electronic transmission. I acknowledge that I have been given the opportunity to request an in-person assessment or other available alternative prior to the telemedicine visit and am voluntarily participating in the telemedicine visit.  I understand that I have the right to withhold or withdraw my consent to the use of telemedicine in the course of my care at any time, without affecting my right to future care or treatment, and that the Practitioner or I may terminate the telemedicine visit at any time. I understand that I have the right to inspect all information obtained and/or recorded in the course of the telemedicine visit and may receive copies of available information for a reasonable fee.  I understand that some of the potential risks of receiving the Services via telemedicine include:   Delay or interruption in medical evaluation due to technological equipment failure or disruption;  Information transmitted may not be sufficient (e.g. poor resolution of images) to allow for appropriate medical decision making by the Practitioner; and/or   In rare instances, security protocols could fail, causing a breach of personal health information.  Furthermore, I acknowledge that it is my responsibility to provide information about my medical history, conditions and care that is complete and accurate to the best of my ability. I acknowledge that Practitioner's advice, recommendations, and/or decision may be based on factors not within their control, such as incomplete or inaccurate data provided by me or distortions of diagnostic images or specimens that may result from electronic transmissions. I understand that the  practice of medicine is not an exact science and that Practitioner makes no warranties or guarantees regarding treatment outcomes. I acknowledge that I will receive a copy of this consent concurrently upon execution via email to the email address I last provided but may also request a printed copy by calling the office of [facility].    I understand that my insurance will be billed for this visit.   I have read or had this consent read to me.  I understand the contents of this consent, which adequately explains the benefits and risks of the Services being provided via telemedicine.   I have been provided ample opportunity to ask questions regarding this consent and the Services and have had my questions answered to my satisfaction.  I give my informed consent for the services to be provided through the use of telemedicine in my medical care  By participating in this telemedicine visit I agree to the above.

## 2018-12-21 ENCOUNTER — Other Ambulatory Visit: Payer: Self-pay | Admitting: Family Medicine

## 2019-01-11 LAB — HM DIABETES EYE EXAM

## 2019-02-19 ENCOUNTER — Other Ambulatory Visit: Payer: Self-pay | Admitting: Family Medicine

## 2019-02-22 ENCOUNTER — Telehealth: Payer: Self-pay

## 2019-02-22 NOTE — Telephone Encounter (Signed)
Attempted to call pt but unable to reach. Left message for pt to return call. 

## 2019-02-22 NOTE — Telephone Encounter (Signed)
Received fax from [facility] stating this patient turned in his cpap equipment. Will send to [facility] as FYI.

## 2019-02-22 NOTE — Telephone Encounter (Signed)
Patient states does not need appointment at this time.

## 2019-02-22 NOTE — Telephone Encounter (Signed)
Noted.    Please contact him to determine if he would like an ROV to discuss alternative options for treating his moderate obstructive sleep apnea.

## 2019-02-22 NOTE — Telephone Encounter (Signed)
Will close message as patient does not need an follow up appt

## 2019-03-12 ENCOUNTER — Encounter: Payer: PRIVATE HEALTH INSURANCE | Admitting: Family Medicine

## 2019-03-13 ENCOUNTER — Telehealth: Payer: Self-pay | Admitting: *Deleted

## 2019-03-13 ENCOUNTER — Encounter: Payer: Self-pay | Admitting: Family Medicine

## 2019-03-13 MED ORDER — METFORMIN HCL 500 MG PO TABS: 500.0000 mg | ORAL_TABLET | Freq: Three times a day (TID) | ORAL | 1 refills | Status: DC

## 2019-03-13 NOTE — Telephone Encounter (Signed)
What symptoms is he having to be out of work?

## 2019-03-13 NOTE — Telephone Encounter (Signed)
Agree with checking cbg's more frequently.  Also recommend renewed efforts to follow diabetic diet. Ensure good water intake.  Recommend we increase metformin to 500mg  TID with meals instead of 1000mg  at once. May be better tolerated.  If sugars not improving with this, let us know and we will start insulin.

## 2019-03-13 NOTE — Telephone Encounter (Signed)
Patient called stating the last 3 readings on his blood sugar have been in the 300 range. Patient stated that he has not been checking his blood sugar on a regular basis, but started feeling poorly, so he started checking his blood sugar a few days ago. Patient stated that he increased his Metformin to 1000 mg yesterday and started feeling nauseated after doing that. Patient stated that he checked his FBS this morning and it was 320. Patient want's to know if he should try to continue taking the Metformin 1000 mg twice a day or should he go back on insulin like he was on before? Patient stated that he know that he has not been eating right lately. [facility]acy [facility]

## 2019-03-13 NOTE — Telephone Encounter (Signed)
Spoke with pt relaying Dr. Synthia Innocent instructions.  Pt verbalizes understanding.   Pt requests a note to be out of work the rest of the week.

## 2019-03-15 NOTE — Telephone Encounter (Addendum)
Spoke with pt asking for sxs to be out of work.  Says he has been off since 03/12/19 due to 359 fasting BS, some dizziness and diarrhea.  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  Pt is ok with letter being sent via [place], if Dr. Darnell Level approves it.

## 2019-03-15 NOTE — Telephone Encounter (Signed)
Letter written and sent through mychart

## 2019-03-18 ENCOUNTER — Encounter (HOSPITAL_BASED_OUTPATIENT_CLINIC_OR_DEPARTMENT_OTHER): Payer: Self-pay | Admitting: *Deleted

## 2019-03-18 ENCOUNTER — Emergency Department (HOSPITAL_BASED_OUTPATIENT_CLINIC_OR_DEPARTMENT_OTHER)
Admission: EM | Admit: 2019-03-18 | Discharge: 2019-03-18 | Disposition: A | Discharge status: Home / Self Care | Payer: BC Managed Care – PPO | Attending: Emergency Medicine | Admitting: Emergency Medicine

## 2019-03-18 ENCOUNTER — Encounter: Payer: Self-pay | Admitting: Family Medicine

## 2019-03-18 ENCOUNTER — Other Ambulatory Visit: Payer: Self-pay

## 2019-03-18 DIAGNOSIS — Z87891 Personal history of nicotine dependence: Secondary | ICD-10-CM | POA: Insufficient documentation

## 2019-03-18 DIAGNOSIS — Z79899 Other long term (current) drug therapy: Secondary | ICD-10-CM | POA: Diagnosis not present

## 2019-03-18 DIAGNOSIS — E1165 Type 2 diabetes mellitus with hyperglycemia: Secondary | ICD-10-CM | POA: Insufficient documentation

## 2019-03-18 DIAGNOSIS — R739 Hyperglycemia, unspecified: Secondary | ICD-10-CM

## 2019-03-18 DIAGNOSIS — I10 Essential (primary) hypertension: Secondary | ICD-10-CM | POA: Diagnosis not present

## 2019-03-18 LAB — URINALYSIS, ROUTINE W REFLEX MICROSCOPIC
Bilirubin Urine: NEGATIVE
Glucose, UA: >=500 mg/dL — AB
Hgb urine dipstick: NEGATIVE
Ketones, ur: NEGATIVE mg/dL
Leukocytes,Ua: NEGATIVE
Nitrite: NEGATIVE
Protein, ur: NEGATIVE mg/dL
Specific Gravity, Urine: 1.02 (ref 1.005–1.030)
pH: 5.5 (ref 5.0–8.0)

## 2019-03-18 LAB — CBC WITH DIFFERENTIAL/PLATELET
Abs Immature Granulocytes: 0.02 10*3/uL (ref 0.00–0.07)
Basophils Absolute: 0 10*3/uL (ref 0.0–0.1)
Basophils Relative: 0 %
Eosinophils Absolute: 0.1 10*3/uL (ref 0.0–0.5)
Eosinophils Relative: 1 %
HCT: 41.1 % (ref 39.0–52.0)
Hemoglobin: 12.9 g/dL — ABNORMAL LOW (ref 13.0–17.0)
Immature Granulocytes: 0 %
Lymphocytes Relative: 29 %
Lymphs Abs: 2.3 10*3/uL (ref 0.7–4.0)
MCH: 27.6 pg (ref 26.0–34.0)
MCHC: 31.4 g/dL (ref 30.0–36.0)
MCV: 87.8 fL (ref 80.0–100.0)
Monocytes Absolute: 0.5 10*3/uL (ref 0.1–1.0)
Monocytes Relative: 6 %
Neutro Abs: 5 10*3/uL (ref 1.7–7.7)
Neutrophils Relative %: 64 %
Platelets: 221 10*3/uL (ref 150–400)
RBC: 4.68 MIL/uL (ref 4.22–5.81)
RDW: 12 % (ref 11.5–15.5)
WBC: 7.9 10*3/uL (ref 4.0–10.5)
nRBC: 0 % (ref 0.0–0.2)

## 2019-03-18 LAB — BASIC METABOLIC PANEL
Anion gap: 11 (ref 5–15)
BUN: 21 mg/dL — ABNORMAL HIGH (ref 6–20)
CO2: 25 mmol/L (ref 22–32)
Calcium: 9.2 mg/dL (ref 8.9–10.3)
Chloride: 98 mmol/L (ref 98–111)
Creatinine, Ser: 1.01 mg/dL (ref 0.61–1.24)
GFR calc Af Amer: >60 mL/min (ref >60)
GFR calc non Af Amer: >60 mL/min (ref >60)
Glucose, Bld: 394 mg/dL — ABNORMAL HIGH (ref 70–99)
Potassium: 3.9 mmol/L (ref 3.5–5.1)
Sodium: 134 mmol/L — ABNORMAL LOW (ref 135–145)

## 2019-03-18 LAB — URINALYSIS, MICROSCOPIC (REFLEX)

## 2019-03-18 LAB — CBG MONITORING, ED: Glucose-Capillary: 381 mg/dL — ABNORMAL HIGH (ref 70–99)

## 2019-03-18 MED ORDER — SODIUM CHLORIDE 0.9 % IV BOLUS
1000.0000 mL | Freq: Once | INTRAVENOUS | Status: AC
  Administered 2019-03-18: 1000 mL via INTRAVENOUS

## 2019-03-18 NOTE — ED Triage Notes (Addendum)
Pt arrived with a concern about his blood sugar being elevated. States he did speak to his MD on Thursday regarding his concern about the elevated blood sugar and was told to increase his Metformin to 500mg  TID states he has had a general h/a and feeling a little dizzy as well. C/o his feet tingling at times. C/o "upset stomach" which he contributes to the increase in metormin. C/o left flank pain at times and increase in urine frequency.

## 2019-03-18 NOTE — ED Provider Notes (Addendum)
Care transferred to me.  Labs are reassuring.  He has hyperglycemia but no evidence of acidosis.  He feels okay at this time and his vitals are okay besides some mild hypertension.  Discussed need/importance for following up with PCP for outpatient blood sugar control.   Sherwood Gambler, MD 03/18/19 [id number]    Sherwood Gambler, MD 03/18/19 [phone]
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
## 2019-03-18 NOTE — Telephone Encounter (Signed)
See phone note. Seen today at ER. Will await eval.

## 2019-03-18 NOTE — ED Notes (Signed)
CBG 381 

## 2019-03-18 NOTE — ED Notes (Signed)
ED Provider at bedside discussing test results and dispo plan of care. 

## 2019-03-19 ENCOUNTER — Telehealth: Payer: Self-pay | Admitting: Family Medicine

## 2019-03-19 NOTE — Telephone Encounter (Signed)
Pt left v/m that pt needs more detailed specific note why pt was out of work last wk. See noted from [place] from [employer] in this phone note as well.

## 2019-03-19 NOTE — Telephone Encounter (Signed)
Letter written and in Lisa's box.  

## 2019-03-19 NOTE — Telephone Encounter (Signed)
Jonathan Compton @ dr Mariana Kaufman office Called they received clearance notesthey need more information.  What was he release for  Best number [phone] Ext [phone]

## 2019-03-19 NOTE — Telephone Encounter (Signed)
Lilia Pro with [employer] left v/m that return to work note does not list specifically what pt was treated for. Needs updated note with what pt was treated for (dx). Lilia Pro said they were told pt was seen due to blood sugar and med adjustment. Also needs noted that pt is on plan of care with Dr Danise Mina and being treated for high blood sugar. Trying to get note to clarify why pt was seen so pt can be released to return to work. Please advise.

## 2019-03-19 NOTE — Telephone Encounter (Signed)
Plz schedule OV for hyperglycemia f/u.

## 2019-03-20 NOTE — Telephone Encounter (Signed)
Spoke with pt, confirms he saw Dr. Synthia Innocent message and will call back to schedule f/u.

## 2019-03-20 NOTE — Telephone Encounter (Signed)
Spoke with pt notifying him Dr. Darnell Level has written another letter.  Pt confirmed he printed letter via MyChart and expresses his thanks.

## 2019-03-22 NOTE — Telephone Encounter (Signed)
Jonathan Compton with dr Mariana Kaufman office calling that needs updated letter with dx and if any restrictions. 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 Jonathan Compton wants copy of 2nd letter faxed to [phone]. I asked if she had cked with pt who got copy of letter thru [place] and she said he was on the road driving.Please advise.

## 2019-03-26 NOTE — Telephone Encounter (Signed)
Spoke with Lilia Pro asking did she receive a copy of updated letter from the pt.  Confirms she did so nothing else is needed at this time.

## 2019-03-27 ENCOUNTER — Encounter: Payer: Self-pay | Admitting: Family Medicine

## 2019-04-04 ENCOUNTER — Telehealth: Payer: Self-pay

## 2019-04-04 DIAGNOSIS — I10 Essential (primary) hypertension: Secondary | ICD-10-CM | POA: Diagnosis not present

## 2019-04-04 DIAGNOSIS — Z87891 Personal history of nicotine dependence: Secondary | ICD-10-CM | POA: Diagnosis not present

## 2019-04-04 DIAGNOSIS — R739 Hyperglycemia, unspecified: Secondary | ICD-10-CM | POA: Diagnosis not present

## 2019-04-04 DIAGNOSIS — E1165 Type 2 diabetes mellitus with hyperglycemia: Secondary | ICD-10-CM | POA: Diagnosis not present

## 2019-04-04 DIAGNOSIS — Z7984 Long term (current) use of oral hypoglycemic drugs: Secondary | ICD-10-CM | POA: Diagnosis not present

## 2019-04-04 NOTE — Telephone Encounter (Signed)
Noted. Thank you. Appreciate you getting him in tomorrow.

## 2019-04-04 NOTE — Telephone Encounter (Signed)
Pt said he ate lunch at 130 and nothing else all day. Drank water all day. This am his FBS was 303. Readings have been higher than this. Ready for insulin.   Pt is in [place] today and asked that [facility] [place] [place] additional refills to [place].  Please pt at [phone] with any questions.

## 2019-04-04 NOTE — Telephone Encounter (Signed)
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 Pt took metformin 500 mg this AM as usual. Now pt is tired,dizzy and extremely thirsty. Pt sitting on side of rd in [place]. Dr Darnell Level said pt needs eval in UC or ED in [place]. Pt went on line and there is an UC 2 miles from pts location. Advised if dizzy should not drive; pt said that was earlier when in back of truck unloading and got very hot. Pt will call 911 if has difficulty driving. Pt is going to UC now. Pt scheduled 30 ' appt with Dr Darnell Level on 04/05/19 at 12 noon. Pt will cb if problem getting to appt. Pt has diarrhea today and muscle cramping in legs which is unusual for pt. Pt said he may ask UC to do rapid covid test. If positive pt will call and cancel appt on 04/05/19 or change to virtual but if negative will leave appt as in office. I did call pt back and he did make it to the UC.FYI to Dr Darnell Level.

## 2019-04-05 ENCOUNTER — Ambulatory Visit: Payer: BC Managed Care – PPO | Admitting: Family Medicine

## 2019-04-05 MED ORDER — LANTUS SOLOSTAR 100 UNIT/ML ~~LOC~~ SOPN: 20.0000 [IU] | PEN_INJECTOR | Freq: Every day | SUBCUTANEOUS | 3 refills | Status: DC

## 2019-04-05 MED ORDER — LANTUS SOLOSTAR 100 UNIT/ML ~~LOC~~ SOPN: 15.0000 [IU] | PEN_INJECTOR | Freq: Every day | SUBCUTANEOUS | 0 refills | Status: DC

## 2019-04-05 NOTE — Telephone Encounter (Signed)
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 Pt took BS now and BS now is 351; pt said now he is feeling OK.  Pt request insulin sent ASAP to [place] to see if can get BS below 200 today so pt can drive and come home today in his own truck instead of waiting until 04/06/19 to return home. Pt request cb when insulin is called in.

## 2019-04-05 NOTE — Addendum Note (Signed)
Addended by: Ria Bush on: 04/05/2019 11:37 AM   Modules accepted: Orders

## 2019-04-05 NOTE — Telephone Encounter (Signed)
Pt notified as instructed and voiced understanding; pt will pick up insulin and start med; pt is very appreciative to Dr Darnell Level for all he has done. Pt will keep appt on 04/08/19.

## 2019-04-05 NOTE — Addendum Note (Signed)
Addended by: Ria Bush on: 04/05/2019 11:08 AM   Modules accepted: Orders

## 2019-04-05 NOTE — Telephone Encounter (Signed)
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 04/05/19 FBS at 715 AM 285. Pt ate breakfast has taken metformin and pt is not feeling bad now. On 04/04/19 pt went to ED in [place] and was given 10 units of insulin and a bad of fluids; when pt left ED BS 403; last night at [facility] 230. Pt is waiting on driver from [facility] to come to [place] and will unload pts truck into another truck and then that driver will bring pt home. Pt has no idea what time he will get back home today. Dr Darnell Level said he will review note and possibly prescribe insulin; pt has 30' appt on 04/08/19 at 10 AM. Pt will come fasting if labs needed. [facility]. ED precautions given and pt voiced understanding.Pt has no covid symptoms, no travel and no known exposure to + covid. Pt request cb after med sent to pharmacy with any further instructions also.

## 2019-04-05 NOTE — Telephone Encounter (Signed)
Noted  

## 2019-04-05 NOTE — Telephone Encounter (Signed)
Thank you. plz notify I've sent in lantus long acting insulin to start 20u daily. This is long acting medicine and he may not see sugars below 200 for next few days. Keep appt on Monday.

## 2019-04-08 ENCOUNTER — Other Ambulatory Visit: Payer: Self-pay

## 2019-04-08 ENCOUNTER — Ambulatory Visit: Payer: BC Managed Care – PPO | Admitting: Family Medicine

## 2019-04-08 ENCOUNTER — Encounter: Payer: Self-pay | Admitting: Family Medicine

## 2019-04-08 VITALS — BP 122/82 | HR 92 | Temp 97.9°F | Ht 75.0 in | Wt 306.6 lb

## 2019-04-08 DIAGNOSIS — Z794 Long term (current) use of insulin: Secondary | ICD-10-CM

## 2019-04-08 DIAGNOSIS — E1169 Type 2 diabetes mellitus with other specified complication: Secondary | ICD-10-CM

## 2019-04-08 LAB — POCT GLYCOSYLATED HEMOGLOBIN (HGB A1C): Hemoglobin A1C: 11.9 % — AB (ref 4.0–5.6)

## 2019-04-08 NOTE — Progress Notes (Signed)
This visit was conducted in person.  BP 122/82 (BP Location: Left Arm, Patient Position: Sitting, Cuff Size: Large)   Pulse 92   Temp 97.9 F (36.6 C) (Temporal)   Ht 6\' 3"  (1.905 m)   Wt (!) 306 lb 9 oz (139.1 kg)   SpO2 97%   BMI 38.32 kg/m    CC: DM Subjective:    Patient ID: Jonathan Compton, male    DOB: 06-Feb-1971, 48 y.o.   MRN: [id number]  HPI: Jonathan Compton is a 48 y.o. male presenting on 04/08/2019 for Elevated Blood Sugar (C/o elevated BS on 04/04/19 while working out of town [place], [place]).  Seen at [facility] )   See recent phone notes for details.  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   DM - does regularly check sugars and brings meter 200-500s. Unclear cause of severe deterioration (A1c 5.6 11/2018). Attributes to possible dietary indiscretion. + polydipsia, polyuria. Compliant with antihyperglycemic regimen which includes: metformin 500mg  TID, newly started lantus 20u daily started Friday. He also started walking more regularly. Denies low sugars or hypoglycemic symptoms. Some foot paresthesias. Last diabetic eye exam DUE. Pneumovax: 10/2017. Prevnar: not due. Glucometer brand: one-touch verio. DSME: 09/2015. Lab Results  Component Value Date   HGBA1C 11.9 (A) 04/08/2019   Diabetic Foot Exam - Simple   Simple Foot Form Diabetic Foot exam was performed with the following findings: Yes 04/08/2019 10:38 AM  Visual Inspection No deformities, no ulcerations, no other skin breakdown bilaterally: Yes Sensation Testing See comments: Yes Pulse Check Posterior Tibialis and Dorsalis pulse intact bilaterally: Yes Comments Diminished sensation to monofilament testing    No results found for: Concepcion Elk      Relevant past medical, surgical, family and social history reviewed and updated as indicated. Interim medical history since our last visit reviewed. Allergies and medications reviewed and updated. Outpatient Medications  Prior to Visit  Medication Sig Dispense Refill  . Blood Pressure Monitoring (BLOOD PRESSURE CUFF) MISC Use as directed to check blood pressures daily or as needed. XL cuff 1 each 0  . famotidine (PEPCID) 20 MG tablet TAKE 1 TABLET BY MOUTH TWICE A DAY 180 tablet 0  . Insulin Glargine (LANTUS SOLOSTAR) 100 UNIT/ML Solostar Pen Inject 15 Units into the skin daily. 2 pen 0  . metFORMIN (GLUCOPHAGE) 500 MG tablet Take 1 tablet (500 mg total) by mouth 3 (three) times daily before meals. 270 tablet 1  . Olmesartan-amLODIPine-HCTZ 40-10-25 MG TABS TAKE 1 TABLET BY MOUTH EVERY DAY 90 tablet 3  . ONETOUCH DELICA LANCETS FINE MISC USE TO CHECK BLOOD SUGAR TWICE DAILY AND AS NEEDED (DX. E11.8 & E11.65) 100 each 5  . ONETOUCH VERIO test strip USE TO CHECK BLOOD SUGAR TWICE DAILY AND AS NEEDED (DX. E11.8 & E11.65) 100 each 1  . albuterol (PROVENTIL HFA;VENTOLIN HFA) 108 (90 Base) MCG/ACT inhaler Inhale 2 puffs into the lungs every 6 (six) hours as needed for wheezing or shortness of breath. 1 Inhaler 0   Facility-Administered Medications Prior to Visit  Medication Dose Route Frequency Provider Last Rate Last Dose  . 0.9 %  sodium chloride infusion    Continuous PRN Tonny Bollman, MD 100 mL/hr at 10/05/16 1017 100 mL/hr at 10/05/16 1017  . fentaNYL (SUBLIMAZE) injection    PRN Tonny Bollman, MD   25 mcg at 10/05/16 1018  . heparin infusion 2 units/mL in 0.9 % sodium chloride    Continuous PRN Tonny Bollman,  MD   1,000 mL at 10/05/16 1043  . heparin injection    PRN Sherren Mocha, MD   5,000 Units at 10/05/16 1023  . iopamidol (ISOVUE-370) 76 % injection    PRN Sherren Mocha, MD   90 mL at 10/05/16 1043  . lidocaine (PF) (XYLOCAINE) 1 % injection    PRN Sherren Mocha, MD   2 mL at 10/05/16 1020  . midazolam (VERSED) injection    PRN Sherren Mocha, MD   1 mg at 10/05/16 1018  . Radial Cocktail/Verapamil only    PRN Sherren Mocha, MD   10 mL at 10/05/16 1022     Per HPI unless specifically  indicated in ROS section below Review of Systems Objective:    BP 122/82 (BP Location: Left Arm, Patient Position: Sitting, Cuff Size: Large)   Pulse 92   Temp 97.9 F (36.6 C) (Temporal)   Ht 6\' 3"  (1.905 m)   Wt (!) 306 lb 9 oz (139.1 kg)   SpO2 97%   BMI 38.32 kg/m   Wt Readings from Last 3 Encounters:  04/08/19 (!) 306 lb 9 oz (139.1 kg)  03/18/19 (!) 325 lb (147.4 kg)  12/11/18 (!) 320 lb (145.2 kg)    Physical Exam Vitals signs and nursing note reviewed.  Constitutional:      General: He is not in acute distress.    Appearance: Normal appearance. He is well-developed. He is obese. He is not ill-appearing.  HENT:     Head: Normocephalic and atraumatic.     Right Ear: External ear normal.     Left Ear: External ear normal.     Nose: Nose normal.     Mouth/Throat:     Mouth: Mucous membranes are moist.     Pharynx: No oropharyngeal exudate.  Eyes:     General: No scleral icterus.    Conjunctiva/sclera: Conjunctivae normal.     Pupils: Pupils are equal, round, and reactive to light.  Neck:     Musculoskeletal: Normal range of motion and neck supple.  Cardiovascular:     Rate and Rhythm: Normal rate and regular rhythm.     Heart sounds: Normal heart sounds. No murmur.  Pulmonary:     Effort: Pulmonary effort is normal. No respiratory distress.     Breath sounds: Normal breath sounds. No wheezing or rales.  Musculoskeletal:     Right lower leg: No edema.     Left lower leg: No edema.     Comments: See HPI for foot exam if done  Lymphadenopathy:     Cervical: No cervical adenopathy.  Skin:    General: Skin is warm and dry.     Findings: No rash.  Neurological:     Mental Status: He is alert.  Psychiatric:        Mood and Affect: Mood normal.        Behavior: Behavior normal.        Assessment & Plan:   Problem List Items Addressed This Visit    Type 2 diabetes mellitus with other specified complication ([place]) - Primary    Chronic, deteriorated,  attributed to dietary indiscretions. Discussed anticipate pancreas overwhelmed and shut down, will provide support with lantus. A1c today. Discussed lantus dosing - will increase to 25u and then slowly taper up as needed for goal cbg <150. Pt will call me or mychart with update in 1-2 wks. F/u at CPE 05/2019.       Relevant Orders   POCT glycosylated hemoglobin (  Hb A1C) (Completed)   Severe obesity (BMI 35.0-39.9) with comorbidity (HCC)       No orders of the defined types were placed in this encounter.  Orders Placed This Encounter  Procedures  . POCT glycosylated hemoglobin (Hb A1C)    Follow up plan: Return in about 2 months (around 06/08/2019) for annual exam, prior fasting for blood work.  Eustaquio BoydenJavier Hyun Reali, MD

## 2019-04-08 NOTE — Patient Instructions (Addendum)
Good to see you today.  A1c today.  Schedule eye exam at your convenience.  Increase lantus to 25 units tonight. Then slowly taper lantus up by 2 units every 3 days if sugars averaging > 150. Max lantus will be 40 units. Call me or mychart with update in 1-2 weeks.  Keep physical appointment

## 2019-04-08 NOTE — Assessment & Plan Note (Signed)
Chronic, deteriorated, attributed to dietary indiscretions. Discussed anticipate pancreas overwhelmed and shut down, will provide support with lantus. A1c today. Discussed lantus dosing - will increase to 25u and then slowly taper up as needed for goal cbg <150. Pt will call me or mychart with update in 1-2 wks. F/u at CPE 05/2019.

## 2019-04-15 ENCOUNTER — Telehealth: Payer: Self-pay | Admitting: Family Medicine

## 2019-04-15 NOTE — Telephone Encounter (Signed)
Patient was told to call back if his blood sugar numbers didn't go under 150.  Patient said his blood sugar this morning was 233 fasting. [facility] uses [facility].

## 2019-04-15 NOTE — Telephone Encounter (Signed)
Placed form in Dr. G's box.  

## 2019-04-15 NOTE — Telephone Encounter (Signed)
Spoke with pt to confirm he was tapering up by 2 units as instructed at last OV.  Pt confirms and states he is now doing 30 units and fasting BS was 233 this morning.  Pls advise.

## 2019-04-15 NOTE — Telephone Encounter (Signed)
Pt dropped off special accomodation form for his employer to be filled out. Placed paperwork in RX tower for Dr. Darnell Level.  He also included his blood sugar readings.   CB [phone]

## 2019-04-15 NOTE — Telephone Encounter (Addendum)
Noted. Continue tapering lantus up by 2 units but now every 2 days if average sugar >150. Call us when he reaches 40 units or if he starts getting low sugars <70.

## 2019-04-15 NOTE — Telephone Encounter (Signed)
Pt notified as instructed by phone.  Pt verbalizes understanding.  Pt states he will need another note for work stating specific reasons (ie., blood sugars above/below a certain number, how it could affect him driving, etc.).  Pt requests letter be sent to him via [place].

## 2019-04-16 NOTE — Telephone Encounter (Addendum)
Letter written and routed to patient.  Reviewed sugar log - PM sugars are staying very elevated. Continue lantus taper.

## 2019-04-16 NOTE — Telephone Encounter (Addendum)
Faxed form.  Mailed original back to pt, per pt request.  Placed copy to be scanned.

## 2019-04-16 NOTE — Telephone Encounter (Signed)
Filled and in Lisa's box 

## 2019-04-17 ENCOUNTER — Telehealth: Payer: Self-pay | Admitting: Family Medicine

## 2019-04-17 DIAGNOSIS — E1169 Type 2 diabetes mellitus with other specified complication: Secondary | ICD-10-CM

## 2019-04-17 NOTE — Telephone Encounter (Signed)
Best number [phone]  Pt would like a call from lisa regarding His blood glucose number  He stated he brought these in on Monday And has a question

## 2019-04-17 NOTE — Telephone Encounter (Signed)
Spoke to pt. He would like to be referred to an endocrinologist. But, he needs to have an appt within 30 days or he will lose his job.

## 2019-04-18 MED ORDER — LANTUS SOLOSTAR 100 UNIT/ML ~~LOC~~ SOPN: 35.0000 [IU] | PEN_INJECTOR | Freq: Every day | SUBCUTANEOUS | 0 refills | Status: DC

## 2019-04-18 MED ORDER — METFORMIN HCL 1000 MG PO TABS: 1000.0000 mg | ORAL_TABLET | Freq: Two times a day (BID) | ORAL | 1 refills | Status: DC

## 2019-04-18 NOTE — Telephone Encounter (Signed)
Was able to schedule patient with Dr Jacelyn Pi with [facility] for tomorrow at 8:00am, tried [facility] but could not get through to the office. Patient is aware and will go to the Appt tomorrow

## 2019-04-18 NOTE — Telephone Encounter (Signed)
Referral placed. Spoke with patient. Has been on 30u last 3 days. Fasting averaging 240, postprandial even higher.  Will increase lantus to 35 u x3 days then he will call me with updated readings. Will also increase metformin to 1000mg  bid.

## 2019-04-19 DIAGNOSIS — I10 Essential (primary) hypertension: Secondary | ICD-10-CM | POA: Diagnosis not present

## 2019-04-19 DIAGNOSIS — E1165 Type 2 diabetes mellitus with hyperglycemia: Secondary | ICD-10-CM | POA: Diagnosis not present

## 2019-04-24 ENCOUNTER — Telehealth: Payer: Self-pay | Admitting: *Deleted

## 2019-04-24 DIAGNOSIS — E1165 Type 2 diabetes mellitus with hyperglycemia: Secondary | ICD-10-CM | POA: Diagnosis not present

## 2019-04-24 NOTE — Telephone Encounter (Signed)
Spoke with pt relaying Dr. Synthia Innocent message.  Pt verbalizes understanding and says he got the letter.

## 2019-04-24 NOTE — Telephone Encounter (Signed)
Patient called stating that he did a virtual visit today with his endochronologist and she released him to go back to work. Patient stated that Dr. Chalmers Cater is sending you notes. Patient stated that he assumes that Dr. Danise Mina will need to release him to go back to work since he is his PCP and he was the one to take him out. Patient requested a call back to find out the next step to be able to go back to work.

## 2019-04-24 NOTE — Telephone Encounter (Signed)
Letter to return to work written and in chart.  Let us know if he needs it sent somewhere  Will await endo note.

## 2019-04-26 DIAGNOSIS — E119 Type 2 diabetes mellitus without complications: Secondary | ICD-10-CM | POA: Diagnosis not present

## 2019-04-26 DIAGNOSIS — Z1159 Encounter for screening for other viral diseases: Secondary | ICD-10-CM | POA: Diagnosis not present

## 2019-04-26 DIAGNOSIS — R030 Elevated blood-pressure reading, without diagnosis of hypertension: Secondary | ICD-10-CM | POA: Diagnosis not present

## 2019-05-01 DIAGNOSIS — E119 Type 2 diabetes mellitus without complications: Secondary | ICD-10-CM | POA: Diagnosis not present

## 2019-05-03 ENCOUNTER — Telehealth: Payer: Self-pay | Admitting: Family Medicine

## 2019-05-03 DIAGNOSIS — E1169 Type 2 diabetes mellitus with other specified complication: Secondary | ICD-10-CM

## 2019-05-03 DIAGNOSIS — Z794 Long term (current) use of insulin: Secondary | ICD-10-CM

## 2019-05-03 NOTE — Telephone Encounter (Signed)
Received fax.  Paperwork is in the rx tower.

## 2019-05-03 NOTE — Telephone Encounter (Signed)
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  Patient will fax over forms to be filled out.

## 2019-05-03 NOTE — Telephone Encounter (Signed)
Placed paperwork in Dr Synthia Innocent inbox

## 2019-05-06 NOTE — Telephone Encounter (Signed)
Pt called checking on form

## 2019-05-06 NOTE — Telephone Encounter (Addendum)
Form filled and in my out box.  We are early for A1c but if pt desires he can come in for A1c - then plz print out result to attach to forms above.

## 2019-05-06 NOTE — Telephone Encounter (Signed)
Pt called wanting to see when he can get this paperwork he needs asap so he can go back to work.  Pt stated he needs an A1c lab done also   Best number [phone]

## 2019-05-07 ENCOUNTER — Other Ambulatory Visit (INDEPENDENT_AMBULATORY_CARE_PROVIDER_SITE_OTHER): Payer: BC Managed Care – PPO

## 2019-05-07 DIAGNOSIS — Z794 Long term (current) use of insulin: Secondary | ICD-10-CM | POA: Diagnosis not present

## 2019-05-07 DIAGNOSIS — E1169 Type 2 diabetes mellitus with other specified complication: Secondary | ICD-10-CM

## 2019-05-07 LAB — POCT GLYCOSYLATED HEMOGLOBIN (HGB A1C): Hemoglobin A1C: 8 % — AB (ref 4.0–5.6)

## 2019-05-07 NOTE — Telephone Encounter (Signed)
Patient advised. Patient did want to come by and get A1C re checked. If it is below 10 then he will be able to drive. Order is already in. Patient will pick up form that was completed when he gets here. Made copy for the chart to be scanned. No charge for form completion

## 2019-05-07 NOTE — Telephone Encounter (Signed)
Azalee Course spoke with pt

## 2019-05-07 NOTE — Addendum Note (Signed)
Addended by: Ria Bush on: 05/07/2019 07:38 AM   Modules accepted: Orders

## 2019-05-27 ENCOUNTER — Encounter: Payer: BLUE CROSS/BLUE SHIELD | Admitting: Family Medicine

## 2019-05-31 ENCOUNTER — Ambulatory Visit: Payer: BC Managed Care – PPO | Admitting: Family Medicine

## 2019-06-28 ENCOUNTER — Encounter: Payer: Self-pay | Admitting: Family Medicine

## 2019-06-28 ENCOUNTER — Ambulatory Visit (INDEPENDENT_AMBULATORY_CARE_PROVIDER_SITE_OTHER): Payer: BC Managed Care – PPO | Admitting: Family Medicine

## 2019-06-28 ENCOUNTER — Other Ambulatory Visit: Payer: Self-pay

## 2019-06-28 VITALS — BP 136/84 | HR 70 | Temp 98.2°F | Ht 75.0 in | Wt 323.0 lb

## 2019-06-28 DIAGNOSIS — Z0000 Encounter for general adult medical examination without abnormal findings: Secondary | ICD-10-CM | POA: Diagnosis not present

## 2019-06-28 DIAGNOSIS — E785 Hyperlipidemia, unspecified: Secondary | ICD-10-CM

## 2019-06-28 DIAGNOSIS — Z125 Encounter for screening for malignant neoplasm of prostate: Secondary | ICD-10-CM

## 2019-06-28 DIAGNOSIS — I10 Essential (primary) hypertension: Secondary | ICD-10-CM

## 2019-06-28 DIAGNOSIS — E1169 Type 2 diabetes mellitus with other specified complication: Secondary | ICD-10-CM | POA: Diagnosis not present

## 2019-06-28 DIAGNOSIS — G4733 Obstructive sleep apnea (adult) (pediatric): Secondary | ICD-10-CM | POA: Diagnosis not present

## 2019-06-28 LAB — COMPREHENSIVE METABOLIC PANEL
ALT: 15 U/L (ref 0–53)
AST: 17 U/L (ref 0–37)
Albumin: 4.1 g/dL (ref 3.5–5.2)
Alkaline Phosphatase: 87 U/L (ref 39–117)
BUN: 13 mg/dL (ref 6–23)
CO2: 31 mEq/L (ref 19–32)
Calcium: 9.5 mg/dL (ref 8.4–10.5)
Chloride: 101 mEq/L (ref 96–112)
Creatinine, Ser: 0.89 mg/dL (ref 0.40–1.50)
GFR: 109.83 mL/min (ref >60.00)
Glucose, Bld: 136 mg/dL — ABNORMAL HIGH (ref 70–99)
Potassium: 4 mEq/L (ref 3.5–5.1)
Sodium: 139 mEq/L (ref 135–145)
Total Bilirubin: 0.6 mg/dL (ref 0.2–1.2)
Total Protein: 6.9 g/dL (ref 6.0–8.3)

## 2019-06-28 LAB — LIPID PANEL
Cholesterol: 153 mg/dL (ref 0–200)
HDL: 26 mg/dL — ABNORMAL LOW (ref >39.00)
NonHDL: 126.88
Total CHOL/HDL Ratio: 6
Triglycerides: 213 mg/dL — ABNORMAL HIGH (ref 0.0–149.0)
VLDL: 42.6 mg/dL — ABNORMAL HIGH (ref 0.0–40.0)

## 2019-06-28 LAB — LDL CHOLESTEROL, DIRECT: Direct LDL: 85 mg/dL

## 2019-06-28 LAB — TSH: TSH: 0.67 u[IU]/mL (ref 0.35–4.50)

## 2019-06-28 LAB — PSA: PSA: 0.76 ng/mL (ref 0.10–4.00)

## 2019-06-28 MED ORDER — FAMOTIDINE 20 MG PO TABS: 20.0000 mg | ORAL_TABLET | Freq: Two times a day (BID) | ORAL | 3 refills | Status: AC

## 2019-06-28 MED ORDER — METFORMIN HCL 1000 MG PO TABS: 1000.0000 mg | ORAL_TABLET | Freq: Every day | ORAL | 3 refills | Status: DC

## 2019-06-28 NOTE — Assessment & Plan Note (Signed)
Not using CPAP - had trouble tolerating "felt like I couldn't breath".

## 2019-06-28 NOTE — Progress Notes (Signed)
This visit was conducted in person.  BP 136/84   Pulse 70   Temp 98.2 F (36.8 C) (Temporal)   Ht 6\' 3"  (1.905 m)   Wt (!) 323 lb (146.5 kg)   SpO2 98%   BMI 40.37 kg/m    CC: CPE Subjective:    Patient ID: , male    DOB: [birth year], 48 y.o.   MRN: [id number]  HPI: Jonathan Compton is a 48 y.o. male presenting on 06/28/2019 for Annual Exam (Vision Dr 14/10/2018 12/2018) and Diabetes (changed to Metformin QD, and stopped insulin x 1 month)   DM - seeing Dr 01/2019. Compliant with current regimen of metformin 1000mg  once daily. Now off insulin. Using freestyle libre CGM. Upcoming endo appt 07/29/2019. Yesterday fasting 120.  Lab Results  Component Value Date   HGBA1C 8.0 (A) 05/07/2019     Currently fasting today.   Preventative: COLONOSCOPY 09/2015 WNL ([facility])  ESOPHAGOGASTRODUODENOSCOPY 09/2015 WNL Prostate cancer screening - father with prostate cancer dx 67 currently undergoing radiation  Flu - declines  Pneumovax 10/2017 Tdap 2014  Seat belt use discussed  Sunscreen use discussed. No changing moles on skin. 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 Interim medical history since our last visit reviewed. Allergies and medications reviewed and updated. Outpatient Medications Prior to Visit  Medication Sig Dispense Refill  . Blood Pressure Monitoring (BLOOD PRESSURE CUFF) MISC Use as directed to check blood pressures daily or as needed. XL cuff 1 each 0  . Olmesartan-amLODIPine-HCTZ 40-10-25 MG TABS TAKE 1 TABLET BY MOUTH EVERY DAY 90 tablet 3  . ONETOUCH DELICA LANCETS FINE MISC USE TO CHECK BLOOD SUGAR TWICE DAILY AND AS NEEDED (DX. E11.8 &  E11.65) 100 each 5  . ONETOUCH VERIO test strip USE TO CHECK BLOOD SUGAR TWICE DAILY AND AS NEEDED (DX. E11.8 & E11.65) 100 each 1  . famotidine (PEPCID) 20 MG tablet TAKE 1 TABLET BY MOUTH TWICE A DAY 180 tablet 0  . metFORMIN (GLUCOPHAGE) 1000 MG tablet Take 1 tablet (1,000 mg total) by mouth 2 (two) times daily with a meal. (Patient taking differently: Take 1,000 mg by mouth daily with breakfast. ) 180 tablet 1  . Insulin Glargine (LANTUS SOLOSTAR) 100 UNIT/ML Solostar Pen Inject 35 Units into the skin at bedtime. (Patient not taking: Reported on 06/28/2019) 5 pen 0   Facility-Administered Medications Prior to Visit  Medication Dose Route Frequency Provider Last Rate Last Dose  . 0.9 %  sodium chloride infusion    Continuous PRN 12-16-2005, MD 100 mL/hr at 10/05/16 1017 100 mL/hr at 10/05/16 1017  . fentaNYL (SUBLIMAZE) injection    PRN 10/07/16, MD   25 mcg at 10/05/16 1018  . heparin infusion 2 units/mL in 0.9 % sodium chloride    Continuous PRN Tonny Bollman, MD   1,000 mL at 10/05/16 1043  . heparin injection    PRN Tonny Bollman, MD   5,000 Units at 10/05/16 1023  . iopamidol (ISOVUE-370) 76 % injection    PRN Tonny Bollman, MD   90 mL at 10/05/16 1043  . lidocaine (PF) (XYLOCAINE) 1 % injection  PRN Tonny Bollman, MD   2 mL at 10/05/16 1020  . midazolam (VERSED) injection    PRN Tonny Bollman, MD   1 mg at 10/05/16 1018  . Radial Cocktail/Verapamil only    PRN Tonny Bollman, MD   10 mL at 10/05/16 1022     Per HPI unless specifically indicated in ROS section below Review of Systems  Constitutional: Negative for activity change, appetite change, chills, fatigue, fever and unexpected weight change.  HENT: Negative for hearing loss.   Eyes: Negative for visual disturbance.  Respiratory: Negative for cough, chest tightness, shortness of breath and wheezing.   Cardiovascular: Negative for chest pain, palpitations and leg swelling.  Gastrointestinal: Negative for  abdominal distention, abdominal pain, blood in stool, constipation, diarrhea, nausea and vomiting.  Genitourinary: Negative for difficulty urinating and hematuria.  Musculoskeletal: Negative for arthralgias, myalgias and neck pain.  Skin: Negative for rash.  Neurological: Negative for dizziness, seizures, syncope and headaches.  Hematological: Negative for adenopathy. Does not bruise/bleed easily.  Psychiatric/Behavioral: Negative for dysphoric mood. The patient is not nervous/anxious.   All other systems reviewed and are negative.  Objective:    BP 136/84   Pulse 70   Temp 98.2 F (36.8 C) (Temporal)   Ht  (1.905 m)   Wt (!) 323 lb (146.5 kg)   SpO2 98%   BMI 40.37 kg/m   Wt Readings from Last 3 Encounters:  06/28/19 (!) 323 lb (146.5 kg)  04/08/19 (!) 306 lb 9 oz (139.1 kg)  03/18/19 (!) 325 lb (147.4 kg)    Physical Exam Vitals signs and nursing note reviewed.  Constitutional:      General: He is not in acute distress.    Appearance: Normal appearance. He is well-developed. He is obese. He is not ill-appearing.  HENT:     Head: Normocephalic and atraumatic.     Right Ear: Hearing, tympanic membrane, ear canal and external ear normal.     Left Ear: Hearing, tympanic membrane, ear canal and external ear normal.     Nose: Nose normal.     Mouth/Throat:     Mouth: Mucous membranes are moist.     Pharynx: Oropharynx is clear. Uvula midline. No posterior oropharyngeal erythema.  Eyes:     General: No scleral icterus.    Extraocular Movements: Extraocular movements intact.     Conjunctiva/sclera: Conjunctivae normal.     Pupils: Pupils are equal, round, and reactive to light.  Neck:     Musculoskeletal: Normal range of motion and neck supple.  Cardiovascular:     Rate and Rhythm: Normal rate and regular rhythm.     Pulses: Normal pulses.          Radial pulses are 2+ on the right side and 2+ on the left side.     Heart sounds: Normal heart sounds. No murmur.   Pulmonary:     Effort: Pulmonary effort is normal. No respiratory distress.     Breath sounds: Normal breath sounds. No wheezing, rhonchi or rales.  Abdominal:     General: Abdomen is flat. Bowel sounds are normal. There is no distension.     Palpations: Abdomen is soft. There is no mass.     Tenderness: There is no abdominal tenderness. There is no guarding or rebound.     Hernia: No hernia is present.  Musculoskeletal: Normal range of motion.     Right lower leg: No edema.     Left lower leg: No edema.  Lymphadenopathy:  Cervical: No cervical adenopathy.  Skin:    General: Skin is warm and dry.     Findings: No rash.  Neurological:     General: No focal deficit present.     Mental Status: He is alert and oriented to person, place, and time.     Comments: CN grossly intact, station and gait intact  Psychiatric:        Mood and Affect: Mood normal.        Behavior: Behavior normal.        Thought Content: Thought content normal.        Judgment: Judgment normal.       Results for orders placed or performed in visit on 05/07/19  POCT glycosylated hemoglobin (Hb A1C)  Result Value Ref Range   Hemoglobin A1C 8.0 (A) 4.0 - 5.6 %   HbA1c POC (<> result, Jonathan entry)     HbA1c, POC (prediabetic range)     HbA1c, POC (controlled diabetic range)     Lab Results  Component Value Date   PSA 0.69 Test Methodology: Hybritech PSA 03/05/2010   Lab Results  Component Value Date   CHOL 140 11/23/2018   HDL 27 (L) 11/23/2018   LDLCALC 87 11/23/2018   LDLDIRECT 82.0 11/06/2017   TRIG 159 (H) 11/23/2018   CHOLHDL 5.2 (H) 11/23/2018   Assessment & Plan:  This visit occurred during the SARS-CoV-2 public health emergency.  Safety protocols were in place, including screening questions prior to the visit, additional usage of staff PPE, and extensive cleaning of exam room while observing appropriate contact time as indicated for disinfecting solutions.   Problem List Items Addressed  This Visit    Type 2 diabetes mellitus with other specified complication ([place])    Chronic, improved and pancreas now again functional as evidenced by being able to come off insulin. Appreciate endo care. Update fructosamine levels today. Encouraged renewed efforts at diabetic diet.       Relevant Medications   metFORMIN (GLUCOPHAGE) 1000 MG tablet   Other Relevant Orders   Fructosamine   OSA (obstructive sleep apnea)    Not using CPAP - had trouble tolerating "felt like I couldn't breath".       Obesity, morbid, BMI 40.0-49.9 ([place])    Encouraged healthy diet and lifestyle changes to affect sustainable weight loss.       Relevant Medications   metFORMIN (GLUCOPHAGE) 1000 MG tablet   Hypertension    Chronic, stable on current regimen       Healthcare maintenance - Primary    Preventative protocols reviewed and updated unless pt declined. Discussed healthy diet and lifestyle.       Dyslipidemia associated with type 2 diabetes mellitus ([place])    Update labs - off statin  The 10-year ASCVD risk score Mikey Bussing DC Jr., et al., 2013) is: 18.4%   Values used to calculate the score:     Age: 72 years     Sex: Male     Is Non-Hispanic African American: Yes     Diabetic: Yes     Tobacco smoker: No     Systolic Blood Pressure: 379 mmHg     Is BP treated: Yes     HDL Cholesterol: 27 mg/dL     Total Cholesterol: 140 mg/dL       Relevant Medications   metFORMIN (GLUCOPHAGE) 1000 MG tablet   Other Relevant Orders   Lipid panel   Comprehensive metabolic panel   TSH    Other  Visit Diagnoses    Special screening for malignant neoplasm of prostate       Relevant Orders   PSA       Meds ordered this encounter  Medications  . metFORMIN (GLUCOPHAGE) 1000 MG tablet    Sig: Take 1 tablet (1,000 mg total) by mouth daily with breakfast.    Dispense:  90 tablet    Refill:  3    Note new sig  . famotidine (PEPCID) 20 MG tablet    Sig: Take 1 tablet (20 mg total) by mouth 2 (two) times  daily.    Dispense:  180 tablet    Refill:  3   Orders Placed This Encounter  Procedures  . Lipid panel  . Comprehensive metabolic panel  . PSA  . TSH  . Fructosamine   Patient instructions: Labs today Work on incorporating regular activity into routine and work on following low sugar low carb diet.  Good to see you today, return as needed or in 6 months for diabetes follow up visit or 1 year for physical.   Follow up plan: Return in about 1 year (around 06/27/2020) for annual exam, prior fasting for blood work.  Eustaquio BoydenJavier Camyla Camposano, MD

## 2019-06-28 NOTE — Assessment & Plan Note (Signed)
Chronic, improved and pancreas now again functional as evidenced by being able to come off insulin. Appreciate endo care. Update fructosamine levels today. Encouraged renewed efforts at diabetic diet.

## 2019-06-28 NOTE — Assessment & Plan Note (Signed)
Update labs - off statin  The 10-year ASCVD risk score Mikey Bussing DC Brooke Bonito., et al., 2013) is: 18.4%   Values used to calculate the score:     Age: 48 years     Sex: Male     Is Non-Hispanic African American: Yes     Diabetic: Yes     Tobacco smoker: No     Systolic Blood Pressure: 076 mmHg     Is BP treated: Yes     HDL Cholesterol: 27 mg/dL     Total Cholesterol: 140 mg/dL

## 2019-06-28 NOTE — Assessment & Plan Note (Signed)
Chronic, stable on current regimen.  

## 2019-06-28 NOTE — Patient Instructions (Addendum)
Labs today Work on incorporating regular activity into routine and work on following low sugar low carb diet.  Good to see you today, return as needed or in 6 months for diabetes follow up visit or 1 year for physical.   Health Maintenance, Male Adopting a healthy lifestyle and getting preventive care are important in promoting health and wellness. Ask your health care provider about:  The right schedule for you to have regular tests and exams.  Things you can do on your own to prevent diseases and keep yourself healthy. What should I know about diet, weight, and exercise? Eat a healthy diet   Eat a diet that includes plenty of vegetables, fruits, low-fat dairy products, and lean protein.  Do not eat a lot of foods that are high in solid fats, added sugars, or sodium. Maintain a healthy weight Body mass index (BMI) is a measurement that can be used to identify possible weight problems. It estimates body fat based on height and weight. Your health care provider can help determine your BMI and help you achieve or maintain a healthy weight. Get regular exercise Get regular exercise. This is one of the most important things you can do for your health. Most adults should:  Exercise for at least 150 minutes each week. The exercise should increase your heart rate and make you sweat (moderate-intensity exercise).  Do strengthening exercises at least twice a week. This is in addition to the moderate-intensity exercise.  Spend less time sitting. Even light physical activity can be beneficial. Watch cholesterol and blood lipids Have your blood tested for lipids and cholesterol at 49 years of age, then have this test every 5 years. You may need to have your cholesterol levels checked more often if:  Your lipid or cholesterol levels are high.  You are older than 49 years of age.  You are at high risk for heart disease. What should I know about cancer screening? Many types of cancers can be  detected early and may often be prevented. Depending on your health history and family history, you may need to have cancer screening at various ages. This may include screening for:  Colorectal cancer.  Prostate cancer.  Skin cancer.  Lung cancer. What should I know about heart disease, diabetes, and high blood pressure? Blood pressure and heart disease  High blood pressure causes heart disease and increases the risk of stroke. This is more likely to develop in people who have high blood pressure readings, are of African descent, or are overweight.  Talk with your health care provider about your target blood pressure readings.  Have your blood pressure checked: ? Every 3-5 years if you are 39-45 years of age. ? Every year if you are 73 years old or older.  If you are between the ages of 5 and 92 and are a current or former smoker, ask your health care provider if you should have a one-time screening for abdominal aortic aneurysm (AAA). Diabetes Have regular diabetes screenings. This checks your fasting blood sugar level. Have the screening done:  Once every three years after age 75 if you are at a normal weight and have a low risk for diabetes.  More often and at a younger age if you are overweight or have a high risk for diabetes. What should I know about preventing infection? Hepatitis B If you have a higher risk for hepatitis B, you should be screened for this virus. Talk with your health care provider to find out  if you are at risk for hepatitis B infection. Hepatitis C Blood testing is recommended for:  Everyone born from [birth year] through 1966.  Anyone with known risk factors for hepatitis C. Sexually transmitted infections (STIs)  You should be screened each year for STIs, including gonorrhea and chlamydia, if: ? You are sexually active and are younger than 49 years of age. ? You are older than 49 years of age and your health care provider tells you that you are at risk  for this type of infection. ? Your sexual activity has changed since you were last screened, and you are at increased risk for chlamydia or gonorrhea. Ask your health care provider if you are at risk.  Ask your health care provider about whether you are at high risk for HIV. Your health care provider may recommend a prescription medicine to help prevent HIV infection. If you choose to take medicine to prevent HIV, you should first get tested for HIV. You should then be tested every 3 months for as long as you are taking the medicine. Follow these instructions at home: Lifestyle  Do not use any products that contain nicotine or tobacco, such as cigarettes, e-cigarettes, and chewing tobacco. If you need help quitting, ask your health care provider.  Do not use street drugs.  Do not share needles.  Ask your health care provider for help if you need support or information about quitting drugs. Alcohol use  Do not drink alcohol if your health care provider tells you not to drink.  If you drink alcohol: ? Limit how much you have to 0-2 drinks a day. ? Be aware of how much alcohol is in your drink. In the [place] one drink equals one 12 oz bottle of beer (355 mL), one 5 oz glass of wine (148 mL), or one 1 oz glass of hard liquor (44 mL). General instructions  Schedule regular health, dental, and eye exams.  Stay current with your vaccines.  Tell your health care provider if: ? You often feel depressed. ? You have ever been abused or do not feel safe at home. Summary  Adopting a healthy lifestyle and getting preventive care are important in promoting health and wellness.  Follow your health care provider's instructions about healthy diet, exercising, and getting tested or screened for diseases.  Follow your health care provider's instructions on monitoring your cholesterol and blood pressure. This information is not intended to replace advice given to you by your health care provider.  Make sure you discuss any questions you have with your health care provider. Document Released: 01/07/2008 Document Revised: 07/04/2018 Document Reviewed: 07/04/2018 Elsevier Patient Education  2020 [employer].

## 2019-06-28 NOTE — Assessment & Plan Note (Signed)
Preventative protocols reviewed and updated unless pt declined. Discussed healthy diet and lifestyle.  

## 2019-06-28 NOTE — Assessment & Plan Note (Signed)
Encouraged healthy diet and lifestyle changes to affect sustainable weight loss.  

## 2019-07-03 LAB — FRUCTOSAMINE: Fructosamine: 261 umol/L (ref 205–285)

## 2019-12-24 ENCOUNTER — Other Ambulatory Visit: Payer: Self-pay | Admitting: Physician Assistant

## 2020-01-03 IMAGING — DX DG CHEST 2V
3 series · 3 of 3 positions shown · non-contrast
Comparison: 02/06/2009

CLINICAL DATA: Sick for 4 days with cough and congestion.

EXAM:
CHEST - 2 VIEW

[chest lat (1 of 2)]
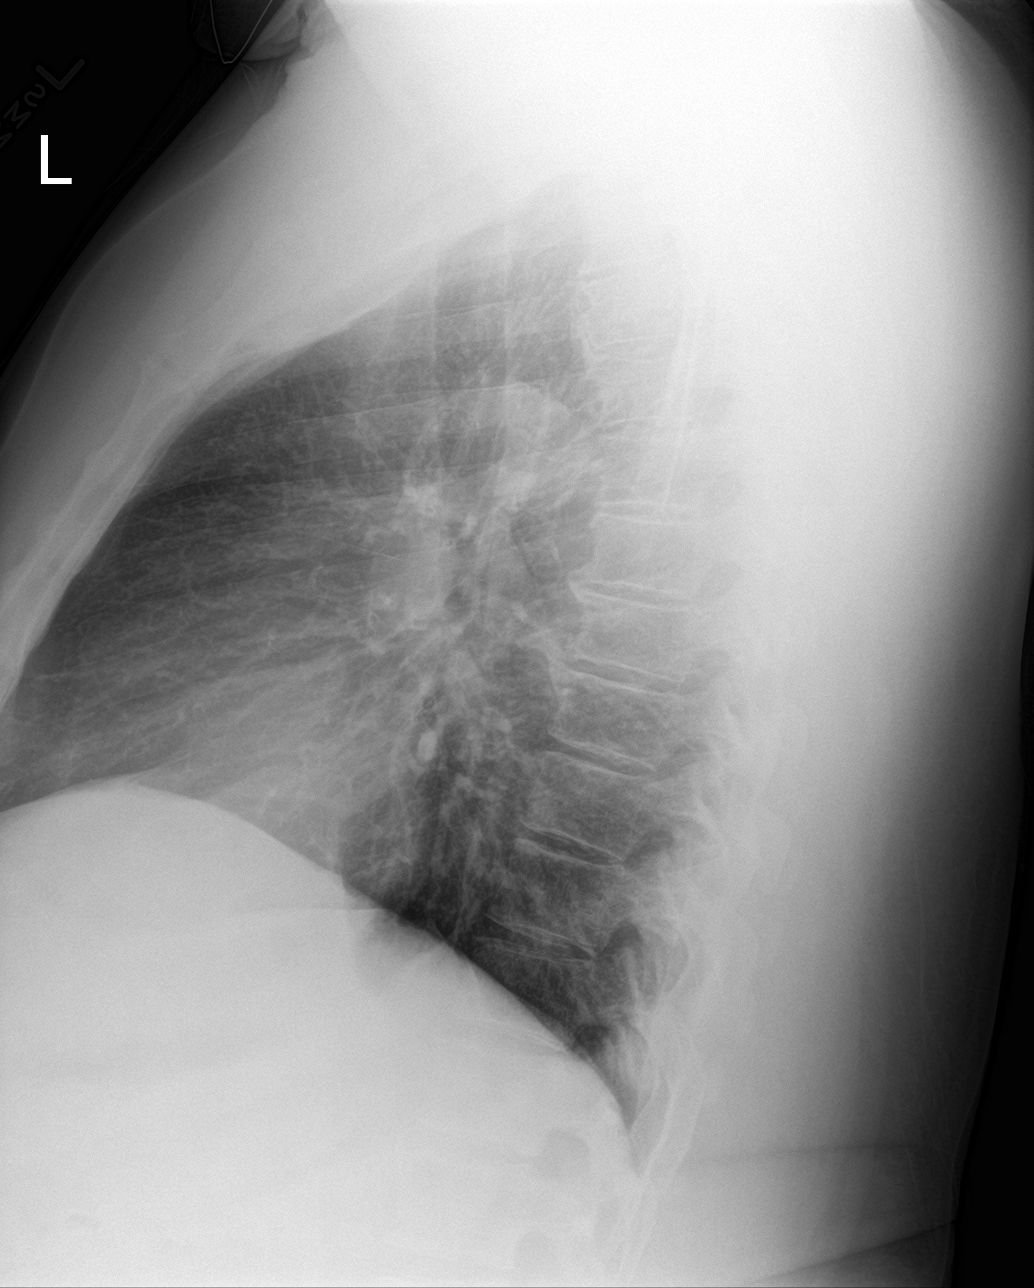

[chest pa]
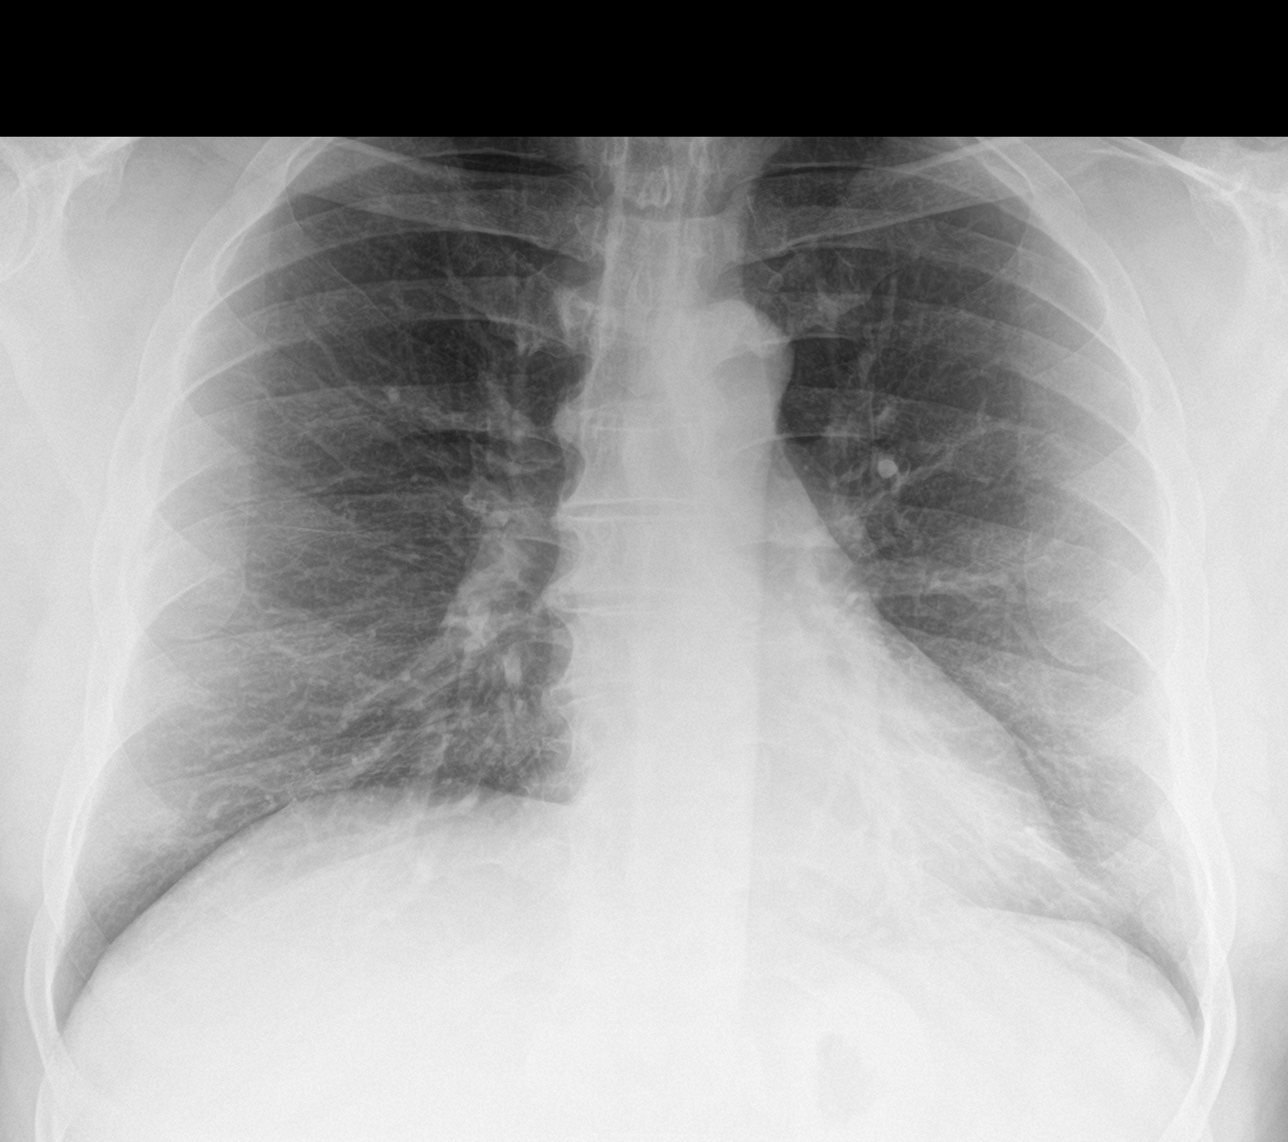

[chest lat (2 of 2)]
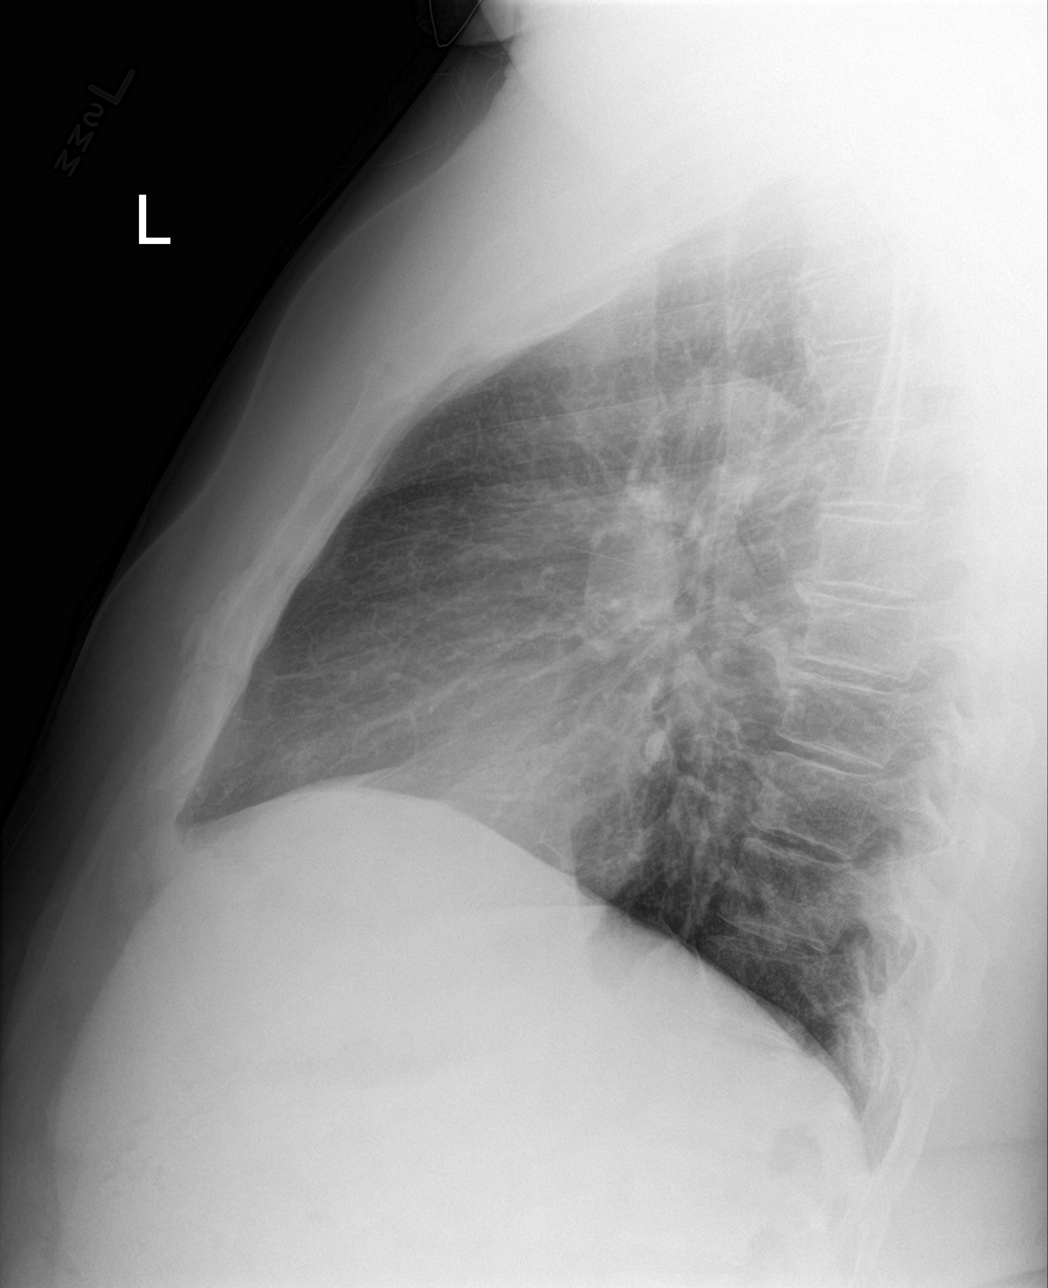

[3 of 3 positions shown; findings below may reference images not displayed]

FINDINGS: The cardiac silhouette, mediastinal and hilar contours are within
normal limits and stable. The lungs are clear of an acute
infiltrate. No pleural effusion or pulmonary edema. The bony thorax
is intact.
IMPRESSION: No acute cardiopulmonary findings.

## 2020-02-12 ENCOUNTER — Encounter: Payer: Self-pay | Admitting: Family Medicine

## 2020-03-15 ENCOUNTER — Encounter: Payer: Self-pay | Admitting: Family Medicine

## 2020-03-16 ENCOUNTER — Ambulatory Visit (INDEPENDENT_AMBULATORY_CARE_PROVIDER_SITE_OTHER): Payer: BC Managed Care – PPO | Admitting: Cardiovascular Disease

## 2020-03-16 ENCOUNTER — Other Ambulatory Visit: Payer: Self-pay

## 2020-03-16 ENCOUNTER — Encounter: Payer: Self-pay | Admitting: Cardiovascular Disease

## 2020-03-16 DIAGNOSIS — R079 Chest pain, unspecified: Secondary | ICD-10-CM

## 2020-03-16 DIAGNOSIS — I10 Essential (primary) hypertension: Secondary | ICD-10-CM | POA: Diagnosis not present

## 2020-03-16 DIAGNOSIS — R0789 Other chest pain: Secondary | ICD-10-CM
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
## 2020-03-16 NOTE — Patient Instructions (Signed)
Medication Instructions:  Your provider recommends that you continue on your current medications as directed. Please refer to the Current Medication list given to you today.   *If you need a refill on your cardiac medications before your next appointment, please call your pharmacy*  Testing/Procedures: Your provider has requested that you have a lexiscan myoview. For further information please visit [email or web address]. Please follow instruction sheet, as given.  Follow-Up: At [facility], you and your health needs are our priority.  As part of our continuing mission to provide you with exceptional heart care, we have created designated Provider Care Teams.  These Care Teams include your primary Cardiologist (physician) and Advanced Practice Providers (APPs -  Physician Assistants and Nurse Practitioners) who all work together to provide you with the care you need, when you need it. Your next appointment:   12 month(s) The format for your next appointment:   In Person Provider:   You may see Michael Cooper, MD or one of the following Advanced Practice Providers on your designated Care Team:    Scott Weaver, PA-C  Vin Bhagat, PA-C   

## 2020-03-16 NOTE — Telephone Encounter (Signed)
Updated pt's immunizations 

## 2020-03-20 ENCOUNTER — Other Ambulatory Visit: Payer: Self-pay | Admitting: Physician Assistant

## 2020-03-23 ENCOUNTER — Ambulatory Visit: Payer: BC Managed Care – PPO | Admitting: Cardiovascular Disease

## 2020-04-03 ENCOUNTER — Telehealth (HOSPITAL_COMMUNITY): Payer: Self-pay | Admitting: *Deleted

## 2020-04-03 NOTE — Telephone Encounter (Signed)
Patient given detailed instructions per Myocardial Perfusion Study Information Sheet for the test on 04/06/20 at 8:15. Patient notified to arrive 15 minutes early and that it is imperative to arrive on time for appointment to keep from having the test rescheduled.  If you need to cancel or reschedule your appointment, please call the office within 24 hours of your appointment. . Patient verbalized understanding.Jonathan Compton

## 2020-04-06 ENCOUNTER — Ambulatory Visit (INDEPENDENT_AMBULATORY_CARE_PROVIDER_SITE_OTHER): Payer: BC Managed Care – PPO

## 2020-04-06 ENCOUNTER — Other Ambulatory Visit: Payer: Self-pay

## 2020-04-06 ENCOUNTER — Ambulatory Visit (INDEPENDENT_AMBULATORY_CARE_PROVIDER_SITE_OTHER): Payer: BC Managed Care – PPO | Admitting: Podiatry

## 2020-04-06 ENCOUNTER — Ambulatory Visit (HOSPITAL_COMMUNITY): Payer: BC Managed Care – PPO | Attending: Cardiology

## 2020-04-06 DIAGNOSIS — M19079 Primary osteoarthritis, unspecified ankle and foot: Secondary | ICD-10-CM

## 2020-04-06 DIAGNOSIS — M19072 Primary osteoarthritis, left ankle and foot: Secondary | ICD-10-CM

## 2020-04-06 DIAGNOSIS — B351 Tinea unguium: Secondary | ICD-10-CM | POA: Diagnosis not present

## 2020-04-06 DIAGNOSIS — E1169 Type 2 diabetes mellitus with other specified complication: Secondary | ICD-10-CM | POA: Diagnosis not present

## 2020-04-06 DIAGNOSIS — R079 Chest pain, unspecified: Secondary | ICD-10-CM | POA: Diagnosis present

## 2020-04-06 DIAGNOSIS — M79675 Pain in left toe(s): Secondary | ICD-10-CM | POA: Diagnosis not present

## 2020-04-06 DIAGNOSIS — M79672 Pain in left foot: Secondary | ICD-10-CM | POA: Diagnosis not present

## 2020-04-06 DIAGNOSIS — M79674 Pain in right toe(s): Secondary | ICD-10-CM | POA: Diagnosis not present

## 2020-04-06 MED ORDER — TECHNETIUM TC 99M TETROFOSMIN IV KIT
30.5000 | PACK | Freq: Once | INTRAVENOUS | Status: AC | PRN
  Administered 2020-04-06: 30.5 via INTRAVENOUS
  Filled 2020-04-06: qty 31

## 2020-04-06 MED ORDER — REGADENOSON 0.4 MG/5ML IV SOLN
0.4000 mg | Freq: Once | INTRAVENOUS | Status: AC
  Administered 2020-04-06: 0.4 mg via INTRAVENOUS

## 2020-04-07 ENCOUNTER — Encounter: Payer: Self-pay | Admitting: Podiatry

## 2020-04-07 ENCOUNTER — Ambulatory Visit (HOSPITAL_COMMUNITY): Payer: BC Managed Care – PPO | Attending: Cardiology

## 2020-04-07 LAB — MYOCARDIAL PERFUSION IMAGING
LV dias vol: 148 mL (ref 62–150)
LV sys vol: 76 mL
Peak HR: 91 {beats}/min
Rest HR: 63 {beats}/min
SDS: 0
SRS: 2
SSS: 2
TID: 1.08

## 2020-04-07 MED ORDER — TECHNETIUM TC 99M TETROFOSMIN IV KIT
31.2000 | PACK | Freq: Once | INTRAVENOUS | Status: AC | PRN
  Administered 2020-04-07: 31.2 via INTRAVENOUS
  Filled 2020-04-07: qty 32

## 2020-04-07 MED ORDER — OLMESARTAN-AMLODIPINE-HCTZ 40-10-25 MG PO TABS: 1.0000 | ORAL_TABLET | Freq: Every day | ORAL | 3 refills | Status: DC

## 2020-04-07 NOTE — Addendum Note (Signed)
Addended by: Margaret Pyle D on: 04/07/2020 08:25 AM   Modules accepted: Orders
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
## 2020-04-08 ENCOUNTER — Other Ambulatory Visit: Payer: Self-pay | Admitting: Podiatry

## 2020-04-08 DIAGNOSIS — M19079 Primary osteoarthritis, unspecified ankle and foot: Secondary | ICD-10-CM

## 2020-04-11 ENCOUNTER — Other Ambulatory Visit: Payer: Self-pay | Admitting: Family Medicine

## 2020-04-14 ENCOUNTER — Encounter: Payer: Self-pay | Admitting: Family Medicine

## 2020-05-08 ENCOUNTER — Ambulatory Visit: Payer: BC Managed Care – PPO | Admitting: Podiatry

## 2020-07-03 ENCOUNTER — Ambulatory Visit: Payer: BC Managed Care – PPO | Admitting: Podiatry

## 2020-07-03 ENCOUNTER — Encounter: Payer: BC Managed Care – PPO | Admitting: Family Medicine

## 2020-07-13 ENCOUNTER — Telehealth: Payer: Self-pay

## 2020-07-13 MED ORDER — METFORMIN HCL 1000 MG PO TABS: 1000.0000 mg | ORAL_TABLET | Freq: Every day | ORAL | 1 refills | Status: DC

## 2020-07-13 NOTE — Telephone Encounter (Signed)
[facility] Night - Client TELEPHONE ADVICE RECORD AccessNurse Patient Name: Jonathan Compton Gender: Male DOB: 08-11-1970 Age: 49 Y 3 M 3 D Return Phone Number: [phone] (Primary) Address: [place] [address] Client [facility] Night - Client Client Site [facility] - Night Physician Eustaquio Boyden - MD Contact Type Call Who Is Calling Patient / Member / Family / Caregiver Call Type Triage / Clinical Relationship To Patient Self Return Phone Number [phone] (Primary) Chief Complaint Blood Sugar High Reason for Call Symptomatic / Request for Health Information Initial Comment Caller states he is out of Metformin 500 mg. Caller states his rx has expired. Caller states his blood sugar is 225. Translation No Nurse Assessment Nurse: Hollice Espy, RN, Rachael Fee Date/Time Lamount Cohen Time): 07/12/2020 4:52:58 PM Confirm and document reason for call. If symptomatic, describe symptoms. ---Caller states he is out of Metformin 500 mg. Caller states his rx has expired. Caller states his blood sugar is 225. Caller states he has spoken with his endocrinologist and she was able to get his RX filled. He is headed to the pharmacy now to pick it up. States he has no new or worsening s/s. Denies triage. Will call back if he needs further assistance. Does the patient have any new or worsening symptoms? ---No Disp. Time Lamount Cohen Time) Disposition Final User 07/12/2020 4:54:29 PM Clinical Call Yes Hollice Espy, RN, Rachael Fee

## 2020-07-13 NOTE — Telephone Encounter (Signed)
Spoke with pt to get clarification of metformin dose.  Current med list shows 1,000 mg daily with breakfast but pt is requesting 500 mg daily.   Spoke with pt, states endo changed dose to 500 mg daily and they have already sent refill.    Updated pt's med list.

## 2020-09-07 ENCOUNTER — Other Ambulatory Visit: Payer: Self-pay

## 2020-09-07 ENCOUNTER — Encounter: Payer: Self-pay | Admitting: Family Medicine

## 2020-09-07 ENCOUNTER — Ambulatory Visit (INDEPENDENT_AMBULATORY_CARE_PROVIDER_SITE_OTHER): Payer: BC Managed Care – PPO | Admitting: Family Medicine

## 2020-09-07 ENCOUNTER — Telehealth: Payer: Self-pay | Admitting: *Deleted

## 2020-09-07 VITALS — BP 136/90 | HR 85 | Temp 97.8°F | Ht 74.5 in | Wt 324.2 lb

## 2020-09-07 DIAGNOSIS — I10 Essential (primary) hypertension: Secondary | ICD-10-CM

## 2020-09-07 DIAGNOSIS — E785 Hyperlipidemia, unspecified: Secondary | ICD-10-CM | POA: Diagnosis not present

## 2020-09-07 DIAGNOSIS — G4733 Obstructive sleep apnea (adult) (pediatric): Secondary | ICD-10-CM

## 2020-09-07 DIAGNOSIS — Z0001 Encounter for general adult medical examination with abnormal findings: Secondary | ICD-10-CM | POA: Diagnosis not present

## 2020-09-07 DIAGNOSIS — E1169 Type 2 diabetes mellitus with other specified complication: Secondary | ICD-10-CM

## 2020-09-07 DIAGNOSIS — R0789 Other chest pain: Secondary | ICD-10-CM

## 2020-09-07 DIAGNOSIS — Z125 Encounter for screening for malignant neoplasm of prostate: Secondary | ICD-10-CM | POA: Diagnosis not present

## 2020-09-07 DIAGNOSIS — Z0000 Encounter for general adult medical examination without abnormal findings: Secondary | ICD-10-CM

## 2020-09-07 DIAGNOSIS — K219 Gastro-esophageal reflux disease without esophagitis: Secondary | ICD-10-CM

## 2020-09-07 LAB — POCT GLYCOSYLATED HEMOGLOBIN (HGB A1C): Hemoglobin A1C: 5.9 % — AB (ref 4.0–5.6)

## 2020-09-07 MED ORDER — METFORMIN HCL 500 MG PO TABS: 500.0000 mg | ORAL_TABLET | Freq: Two times a day (BID) | ORAL | 0 refills | Status: DC

## 2020-09-07 NOTE — Progress Notes (Signed)
Patient ID: Jonathan Compton, male    DOB: 11-Nov-1970, 50 y.o.   MRN: [id number]  This visit was conducted in person.  BP 136/90   Pulse 85   Temp 97.8 F (36.6 C) (Temporal)   Ht 6' 2.5" (1.892 m)   Wt (!) 324 lb 4 oz (147.1 kg)   SpO2 95%   BMI 41.07 kg/m   150/100 on recheck CC: CPE  Subjective:   HPI: Jonathan Compton is a 50 y.o. male presenting on 09/07/2020 for Annual Exam (Requests 30-day rx sent to [facility] and 90-day rx sent to [facility]. )   DM - followed by endo Dr Talmage Nap - last seen >1 yr ago. He has CGM sensor - last used 1 wk ago - 115-120 fasting. He is taking metformin 500mg  bid. HTN - stable period on tribenzor 40/10/25mg  daily.  OSA - trouble tolerating CPAP, not currently using.  Saw cardiology ) 02/2020 s/p myoview stress test.  GERD - managed with pepcid 20mg  bid and occasional dissolved sodium bicarb.  COVID illness 05/2020 - fully recovered.   Preventative: COLONOSCOPY 3/2017WNL ([facility])  ESOPHAGOGASTRODUODENOSCOPY 09/2015 WNL  Prostate cancer screening - father with prostate cancer dx 67 currently undergoing radiation. Continue yearly check. DRE today.  Flu - declines  COVID vaccine - J&J 11/2019 - discussed eligible for booster.  Pneumovax 10/2017 Tdap 2014  Zostavax 06/2020 Seat belt use discussed  Sunscreen use discussed.No changing moles on skin. Wears long sleeves.  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 Interim medical history since our last visit reviewed. Allergies and medications reviewed and updated. Outpatient Medications Prior to Visit  Medication Sig Dispense Refill  .  Blood Pressure Monitoring (BLOOD PRESSURE CUFF) MISC Use as directed to check blood pressures daily or as needed. XL cuff 1 each 0  . Continuous Blood Gluc Receiver (FREESTYLE LIBRE 14 DAY READER) DEVI See admin instructions.    . Continuous Blood Gluc Sensor (FREESTYLE LIBRE 14 DAY SENSOR) MISC See admin instructions.    . famotidine (PEPCID) 20 MG tablet Take 1 tablet (20 mg total) by mouth 2 (two) times daily. 180 tablet 3  . Olmesartan-amLODIPine-HCTZ 40-10-25 MG TABS Take 1 tablet by mouth daily. 90 tablet 3  . metFORMIN (GLUCOPHAGE) 500 MG tablet Take 500 mg by mouth daily.    [employer] DELICA LANCETS FINE MISC USE TO CHECK BLOOD SUGAR TWICE DAILY AND AS NEEDED (DX. E11.8 & E11.65) 100 each 5  . ONETOUCH VERIO test strip USE TO CHECK BLOOD SUGAR TWICE DAILY AND AS NEEDED (DX. E11.8 & E11.65) 100 each 1   Facility-Administered Medications Prior to Visit  Medication Dose Route Frequency Provider Last Rate Last Admin  . 0.9 %  sodium chloride infusion    Continuous PRN 12-16-2005, MD 100 mL/hr at 10/05/16 1017 100 mL/hr at 10/05/16 1017  . fentaNYL (SUBLIMAZE) injection    PRN 10/07/16, MD   25 mcg at 10/05/16 1018  . heparin infusion 2 units/mL in 0.9 % sodium chloride    Continuous PRN Tonny Bollman, MD   1,000 mL at 10/05/16 1043  . heparin injection  PRN Tonny Bollman, MD   5,000 Units at 10/05/16 1023  . iopamidol (ISOVUE-370) 76 % injection    PRN Tonny Bollman, MD   90 mL at 10/05/16 1043  . lidocaine (PF) (XYLOCAINE) 1 % injection    PRN Tonny Bollman, MD   2 mL at 10/05/16 1020  . midazolam (VERSED) injection    PRN Tonny Bollman, MD   1 mg at 10/05/16 1018  . Radial Cocktail/Verapamil only    PRN Tonny Bollman, MD   10 mL at 10/05/16 1022     Per HPI unless specifically indicated in ROS section below Review of Systems  Constitutional: Negative for activity change, appetite change, chills, fatigue, fever and unexpected weight change.  HENT: Negative for  hearing loss.   Eyes: Negative for visual disturbance.  Respiratory: Negative for cough, chest tightness, shortness of breath and wheezing.   Cardiovascular: Positive for chest pain (recent myoview stress test reassuring - ?GERD related). Negative for palpitations and leg swelling.  Gastrointestinal: Negative for abdominal distention, abdominal pain, blood in stool, constipation, diarrhea, nausea and vomiting.  Genitourinary: Negative for difficulty urinating and hematuria.  Musculoskeletal: Negative for arthralgias, myalgias and neck pain.  Skin: Negative for rash.  Neurological: Positive for headaches (mild). Negative for dizziness, seizures and syncope.  Hematological: Negative for adenopathy. Does not bruise/bleed easily.  Psychiatric/Behavioral: Negative for dysphoric mood. The patient is not nervous/anxious.    Objective:  BP 136/90   Pulse 85   Temp 97.8 F (36.6 C) (Temporal)   Ht 6' 2.5" (1.892 m)   Wt (!) 324 lb 4 oz (147.1 kg)   SpO2 95%   BMI 41.07 kg/m   Wt Readings from Last 3 Encounters:  09/07/20 (!) 324 lb 4 oz (147.1 kg)  04/06/20 (!) 334 lb (151.5 kg)  03/16/20 (!) 334 lb 3.2 oz (151.6 kg)      Physical Exam Vitals and nursing note reviewed.  Constitutional:      General: He is not in acute distress.    Appearance: Normal appearance. He is well-developed and well-nourished. He is obese. He is not ill-appearing.  HENT:     Head: Normocephalic and atraumatic.     Right Ear: Hearing, tympanic membrane, ear canal and external ear normal.     Left Ear: Hearing, tympanic membrane, ear canal and external ear normal.     Mouth/Throat:     Mouth: Oropharynx is clear and moist and mucous membranes are normal.     Pharynx: No posterior oropharyngeal edema.  Eyes:     General: No scleral icterus.    Extraocular Movements: Extraocular movements intact and EOM normal.     Conjunctiva/sclera: Conjunctivae normal.     Pupils: Pupils are equal, round, and reactive to  light.  Neck:     Thyroid: No thyroid mass or thyromegaly.  Cardiovascular:     Rate and Rhythm: Normal rate and regular rhythm.     Pulses: Normal pulses and intact distal pulses.          Radial pulses are 2+ on the right side and 2+ on the left side.     Heart sounds: Normal heart sounds. No murmur heard.   Pulmonary:     Effort: Pulmonary effort is normal. No respiratory distress.     Breath sounds: Normal breath sounds. No wheezing, rhonchi or rales.  Abdominal:     General: Abdomen is flat. Bowel sounds are normal. There is no distension.     Palpations: Abdomen is soft.  There is no mass.     Tenderness: There is no abdominal tenderness. There is no guarding or rebound.     Hernia: No hernia is present.  Genitourinary:    Prostate: Normal. Not enlarged, not tender and no nodules present.     Rectum: Normal. No mass, tenderness, anal fissure, external hemorrhoid or internal hemorrhoid. Normal anal tone.  Musculoskeletal:        General: No edema. Normal range of motion.     Cervical back: Normal range of motion and neck supple.     Right lower leg: No edema.     Left lower leg: No edema.  Lymphadenopathy:     Cervical: No cervical adenopathy.  Skin:    General: Skin is warm and dry.     Findings: No rash.  Neurological:     General: No focal deficit present.     Mental Status: He is alert and oriented to person, place, and time.     Comments: CN grossly intact, station and gait intact  Psychiatric:        Mood and Affect: Mood and affect and mood normal.        Behavior: Behavior normal.        Thought Content: Thought content normal.        Judgment: Judgment normal.       Results for orders placed or performed in visit on 09/07/20  Lipid panel  Result Value Ref Range   Cholesterol 139 0 - 200 mg/dL   Triglycerides 119.1198.0 (H) 0.0 - 149.0 mg/dL   HDL 47.8228.30 (L) >95.62>39.00 mg/dL   VLDL 13.039.6 0.0 - 86.540.0 mg/dL   LDL Cholesterol 71 0 - 99 mg/dL   Total CHOL/HDL Ratio 5     NonHDL 110.32   Comprehensive metabolic panel  Result Value Ref Range   Sodium 137 135 - 145 mEq/L   Potassium 3.8 3.5 - 5.1 mEq/L   Chloride 101 96 - 112 mEq/L   CO2 29 19 - 32 mEq/L   Glucose, Bld 101 (H) 70 - 99 mg/dL   BUN 13 6 - 23 mg/dL   Creatinine, Ser 7.840.91 0.40 - 1.50 mg/dL   Total Bilirubin 0.7 0.2 - 1.2 mg/dL   Alkaline Phosphatase 88 39 - 117 U/L   AST 16 0 - 37 U/L   ALT 15 0 - 53 U/L   Total Protein 7.4 6.0 - 8.3 g/dL   Albumin 4.2 3.5 - 5.2 g/dL   GFR 69.6298.34 >95.28>60.00 mL/min   Calcium 9.5 8.4 - 10.5 mg/dL  PSA  Result Value Ref Range   PSA 0.75 0.10 - 4.00 ng/mL  TSH  Result Value Ref Range   TSH 0.85 0.35 - 4.50 uIU/mL  POCT glycosylated hemoglobin (Hb A1C)  Result Value Ref Range   Hemoglobin A1C 5.9 (A) 4.0 - 5.6 %   HbA1c POC (<> result, manual entry)     HbA1c, POC (prediabetic range)     HbA1c, POC (controlled diabetic range)     Assessment & Plan:  This visit occurred during the SARS-CoV-2 public health emergency.  Safety protocols were in place, including screening questions prior to the visit, additional usage of staff PPE, and extensive cleaning of exam room while observing appropriate contact time as indicated for disinfecting solutions.   Problem List Items Addressed This Visit    Type 2 diabetes mellitus with other specified complication (HCC)    Chronic, stable on metformin 500mg  bid.  Has not recently seen endocrinology -  encouraged call to schedule f/u. Metformin refilled x 3 months while he returns to see endo.  He monitors cbg's with CGM (freestyle).       Relevant Medications   metFORMIN (GLUCOPHAGE) 500 MG tablet   Other Relevant Orders   POCT glycosylated hemoglobin (Hb A1C) (Completed)   OSA (obstructive sleep apnea)    H/o this, not using CPAP due to poor tolerance.       Obesity, morbid, BMI 40.0-49.9 (HCC)    Encouraged healthy diet and lifestyle choices to affect sustainable weight loss.       Relevant Medications    metFORMIN (GLUCOPHAGE) 500 MG tablet   Hypertension    BP elevated today - reviewed diet and lifestyle choices to improve control. Advised to buy BP cuff and start monitoring BP at home and let me know if consistently >140/90 to add 2nd antihypertensive to regimen.       Healthcare maintenance - Primary    Preventative protocols reviewed and updated unless pt declined. Discussed healthy diet and lifestyle.       GERD (gastroesophageal reflux disease)    Stable period on pepcid 20mg  BID.  Heartburn symptoms would improve with weight loss.       Dyslipidemia associated with type 2 diabetes mellitus (HCC)    Update FLP off statin. Consider starting in diabetic.  The 10-year ASCVD risk score DC Denman George., et al., 2013) is: 18.9%   Values used to calculate the score:     Age: 34 years     Sex: Male     Is Non-Hispanic African American: Yes     Diabetic: Yes     Tobacco smoker: No     Systolic Blood Pressure: 136 mmHg     Is BP treated: Yes     HDL Cholesterol: 28.3 mg/dL     Total Cholesterol: 139 mg/dL       Relevant Medications   metFORMIN (GLUCOPHAGE) 500 MG tablet   Other Relevant Orders   Lipid panel (Completed)   Comprehensive metabolic panel (Completed)   TSH (Completed)   Chest pain    S/p cardiology eval summer 2021 - s/p reassuring stress test.        Other Visit Diagnoses    Special screening for malignant neoplasm of prostate       Relevant Orders   PSA (Completed)       Meds ordered this encounter  Medications  . metFORMIN (GLUCOPHAGE) 500 MG tablet    Sig: Take 1 tablet (500 mg total) by mouth 2 (two) times daily with a meal.    Dispense:  180 tablet    Refill:  0    Call endo for follow up   Orders Placed This Encounter  Procedures  . Lipid panel  . Comprehensive metabolic panel  . PSA  . TSH  . POCT glycosylated hemoglobin (Hb A1C)    Patient instructions: Labs today  Ok to get COVID booster at your convenience.  You are eligible for  shingles shots. May get here or at pharmacy.  You are doing well today - continue current medicines  Schedule follow up with Dr 01-22-1971.  bp was too high - start walking routine - goal 153min/wk mod intensity aerobic exercise. Increase water, limit salt/sodium in the diet, increase potassium rich foods (fruits, vegetables). Buy BP cuff and start monitoring at home - let me know if consistently >140/90 to add 2nd blood pressure medicine  Return as needed or in 1 year for next  physical.   Follow up plan: Return in about 1 year (around 09/07/2021) for annual exam, prior fasting for blood work.  Eustaquio Boyden, MD

## 2020-09-07 NOTE — Telephone Encounter (Signed)
Noted  

## 2020-09-07 NOTE — Assessment & Plan Note (Signed)
Preventative protocols reviewed and updated unless pt declined. Discussed healthy diet and lifestyle.  

## 2020-09-07 NOTE — Patient Instructions (Addendum)
Labs today  Ok to get COVID booster at your convenience.  You are eligible for shingles shots. May get here or at pharmacy.  You are doing well today - continue current medicines  Schedule follow up with Dr Talmage Nap.  bp was too high - start walking routine - goal 155min/wk mod intensity aerobic exercise. Increase water, limit salt/sodium in the diet, increase potassium rich foods (fruits, vegetables). Buy BP cuff and start monitoring at home - let me know if consistently >140/90 to add 2nd blood pressure medicine  Return as needed or in 1 year for next physical.   Health Maintenance, Male Adopting a healthy lifestyle and getting preventive care are important in promoting health and wellness. Ask your health care provider about:  The right schedule for you to have regular tests and exams.  Things you can do on your own to prevent diseases and keep yourself healthy. What should I know about diet, weight, and exercise? Eat a healthy diet  Eat a diet that includes plenty of vegetables, fruits, low-fat dairy products, and lean protein.  Do not eat a lot of foods that are high in solid fats, added sugars, or sodium.   Maintain a healthy weight Body mass index (BMI) is a measurement that can be used to identify possible weight problems. It estimates body fat based on height and weight. Your health care provider can help determine your BMI and help you achieve or maintain a healthy weight. Get regular exercise Get regular exercise. This is one of the most important things you can do for your health. Most adults should:  Exercise for at least 150 minutes each week. The exercise should increase your heart rate and make you sweat (moderate-intensity exercise).  Do strengthening exercises at least twice a week. This is in addition to the moderate-intensity exercise.  Spend less time sitting. Even light physical activity can be beneficial. Watch cholesterol and blood lipids Have your blood tested for  lipids and cholesterol at 51 years of age, then have this test every 5 years. You may need to have your cholesterol levels checked more often if:  Your lipid or cholesterol levels are high.  You are older than 51 years of age.  You are at high risk for heart disease. What should I know about cancer screening? Many types of cancers can be detected early and may often be prevented. Depending on your health history and family history, you may need to have cancer screening at various ages. This may include screening for:  Colorectal cancer.  Prostate cancer.  Skin cancer.  Lung cancer. What should I know about heart disease, diabetes, and high blood pressure? Blood pressure and heart disease  High blood pressure causes heart disease and increases the risk of stroke. This is more likely to develop in people who have high blood pressure readings, are of African descent, or are overweight.  Talk with your health care provider about your target blood pressure readings.  Have your blood pressure checked: ? Every 3-5 years if you are 69-78 years of age. ? Every year if you are 23 years old or older.  If you are between the ages of 13 and 52 and are a current or former smoker, ask your health care provider if you should have a one-time screening for abdominal aortic aneurysm (AAA). Diabetes Have regular diabetes screenings. This checks your fasting blood sugar level. Have the screening done:  Once every three years after age 67 if you are at a  normal weight and have a low risk for diabetes.  More often and at a younger age if you are overweight or have a high risk for diabetes. What should I know about preventing infection? Hepatitis B If you have a higher risk for hepatitis B, you should be screened for this virus. Talk with your health care provider to find out if you are at risk for hepatitis B infection. Hepatitis C Blood testing is recommended for:  Everyone born from [birth year] through  1966.  Anyone with known risk factors for hepatitis C. Sexually transmitted infections (STIs)  You should be screened each year for STIs, including gonorrhea and chlamydia, if: ? You are sexually active and are younger than 51 years of age. ? You are older than 51 years of age and your health care provider tells you that you are at risk for this type of infection. ? Your sexual activity has changed since you were last screened, and you are at increased risk for chlamydia or gonorrhea. Ask your health care provider if you are at risk.  Ask your health care provider about whether you are at high risk for HIV. Your health care provider may recommend a prescription medicine to help prevent HIV infection. If you choose to take medicine to prevent HIV, you should first get tested for HIV. You should then be tested every 3 months for as long as you are taking the medicine. Follow these instructions at home: Lifestyle  Do not use any products that contain nicotine or tobacco, such as cigarettes, e-cigarettes, and chewing tobacco. If you need help quitting, ask your health care provider.  Do not use street drugs.  Do not share needles.  Ask your health care provider for help if you need support or information about quitting drugs. Alcohol use  Do not drink alcohol if your health care provider tells you not to drink.  If you drink alcohol: ? Limit how much you have to 0-2 drinks a day. ? Be aware of how much alcohol is in your drink. In the [place] one drink equals one 12 oz bottle of beer (355 mL), one 5 oz glass of wine (148 mL), or one 1 oz glass of hard liquor (44 mL). General instructions  Schedule regular health, dental, and eye exams.  Stay current with your vaccines.  Tell your health care provider if: ? You often feel depressed. ? You have ever been abused or do not feel safe at home. Summary  Adopting a healthy lifestyle and getting preventive care are important in promoting  health and wellness.  Follow your health care provider's instructions about healthy diet, exercising, and getting tested or screened for diseases.  Follow your health care provider's instructions on monitoring your cholesterol and blood pressure. This information is not intended to replace advice given to you by your health care provider. Make sure you discuss any questions you have with your health care provider. Document Revised: 07/04/2018 Document Reviewed: 07/04/2018 Elsevier Patient Education  2021 [employer].

## 2020-09-07 NOTE — Telephone Encounter (Signed)
Patient called stating that he has an appointment scheduled this afternoon with Dr. Sharen Hones. Patient stated that he does not want to fast for lab work today and would like to have his labs done tomorrow. Patient scheduled for lab work 09/08/20 at 7:40 am.

## 2020-09-08 ENCOUNTER — Other Ambulatory Visit: Payer: BC Managed Care – PPO

## 2020-09-08 LAB — LIPID PANEL
Cholesterol: 139 mg/dL (ref 0–200)
HDL: 28.3 mg/dL — ABNORMAL LOW (ref >39.00)
LDL Cholesterol: 71 mg/dL (ref 0–99)
NonHDL: 110.32
Total CHOL/HDL Ratio: 5
Triglycerides: 198 mg/dL — ABNORMAL HIGH (ref 0.0–149.0)
VLDL: 39.6 mg/dL (ref 0.0–40.0)

## 2020-09-08 LAB — COMPREHENSIVE METABOLIC PANEL
ALT: 15 U/L (ref 0–53)
AST: 16 U/L (ref 0–37)
Albumin: 4.2 g/dL (ref 3.5–5.2)
Alkaline Phosphatase: 88 U/L (ref 39–117)
BUN: 13 mg/dL (ref 6–23)
CO2: 29 mEq/L (ref 19–32)
Calcium: 9.5 mg/dL (ref 8.4–10.5)
Chloride: 101 mEq/L (ref 96–112)
Creatinine, Ser: 0.91 mg/dL (ref 0.40–1.50)
GFR: 98.34 mL/min (ref >60.00)
Glucose, Bld: 101 mg/dL — ABNORMAL HIGH (ref 70–99)
Potassium: 3.8 mEq/L (ref 3.5–5.1)
Sodium: 137 mEq/L (ref 135–145)
Total Bilirubin: 0.7 mg/dL (ref 0.2–1.2)
Total Protein: 7.4 g/dL (ref 6.0–8.3)

## 2020-09-08 LAB — PSA: PSA: 0.75 ng/mL (ref 0.10–4.00)

## 2020-09-08 LAB — TSH: TSH: 0.85 u[IU]/mL (ref 0.35–4.50)

## 2020-09-10 NOTE — Assessment & Plan Note (Signed)
Stable period on pepcid 20mg  BID.  Heartburn symptoms would improve with weight loss.

## 2020-09-10 NOTE — Assessment & Plan Note (Signed)
Chronic, stable on metformin 500mg  bid.  Has not recently seen endocrinology - encouraged call to schedule f/u. Metformin refilled x 3 months while he returns to see endo.  He monitors cbg's with CGM (freestyle).

## 2020-09-10 NOTE — Assessment & Plan Note (Signed)
H/o this, not using CPAP due to poor tolerance.

## 2020-09-10 NOTE — Assessment & Plan Note (Signed)
S/p cardiology eval summer 2021 - s/p reassuring stress test.

## 2020-09-10 NOTE — Assessment & Plan Note (Signed)
Update FLP off statin. Consider starting in diabetic.  The 10-year ASCVD risk score Jonathan Compton., et al., 2013) is: 18.9%   Values used to calculate the score:     Age: 50 years     Sex: Male     Is Non-Hispanic African American: Yes     Diabetic: Yes     Tobacco smoker: No     Systolic Blood Pressure: 136 mmHg     Is BP treated: Yes     HDL Cholesterol: 28.3 mg/dL     Total Cholesterol: 139 mg/dL

## 2020-09-10 NOTE — Assessment & Plan Note (Signed)
BP elevated today - reviewed diet and lifestyle choices to improve control. Advised to buy BP cuff and start monitoring BP at home and let me know if consistently >140/90 to add 2nd antihypertensive to regimen.

## 2020-09-10 NOTE — Assessment & Plan Note (Signed)
Encouraged healthy diet and lifestyle choices to affect sustainable weight loss.  ?

## 2020-12-09 ENCOUNTER — Other Ambulatory Visit: Payer: Self-pay | Admitting: Family Medicine

## 2021-02-09 ENCOUNTER — Ambulatory Visit (INDEPENDENT_AMBULATORY_CARE_PROVIDER_SITE_OTHER): Payer: BC Managed Care – PPO | Admitting: Podiatry

## 2021-02-09 ENCOUNTER — Other Ambulatory Visit: Payer: Self-pay

## 2021-02-09 ENCOUNTER — Encounter: Payer: Self-pay | Admitting: Podiatry

## 2021-02-09 DIAGNOSIS — M79674 Pain in right toe(s): Secondary | ICD-10-CM

## 2021-02-09 DIAGNOSIS — B351 Tinea unguium: Secondary | ICD-10-CM

## 2021-02-09 DIAGNOSIS — M79675 Pain in left toe(s): Secondary | ICD-10-CM

## 2021-02-09 DIAGNOSIS — E1169 Type 2 diabetes mellitus with other specified complication: Secondary | ICD-10-CM

## 2021-02-09 NOTE — Progress Notes (Signed)
This patient returns to my office for at risk foot care.  This patient requires this care by a professional since this patient will be at risk due to having type 2 diabetes.    This patient is unable to cut nails himself since the patient cannot reach his nails.These nails are painful walking and wearing shoes.  This patient presents for at risk foot care today.  General Appearance  Alert, conversant and in no acute stress.  Vascular  Dorsalis pedis and posterior tibial  pulses are palpable  bilaterally.  Capillary return is within normal limits  bilaterally. Temperature is within normal limits  bilaterally.  Neurologic  Senn-Weinstein monofilament wire test within normal limits  bilaterally. Muscle power within normal limits bilaterally.  Nails Thick disfigured discolored nails with subungual debris  hallux nails bilaterally. No evidence of bacterial infection or drainage bilaterally.  Orthopedic  No limitations of motion  feet .  No crepitus or effusions noted.  No bony pathology or digital deformities noted.  Midfoot  DJD  B/L.  Skin  normotropic skin with no porokeratosis noted bilaterally.  No signs of infections or ulcers noted.     Onychomycosis  Pain in right toes  Pain in left toes  Consent was obtained for treatment procedures.   Mechanical debridement of nails 1-5  bilaterally performed with a nail nipper.  Filed with dremel without incident.    Return office visit    3 months                  Told patient to return for periodic foot care and evaluation due to potential at risk complications.   Abrey Bradway DPM   

## 2021-03-01 ENCOUNTER — Other Ambulatory Visit: Payer: Self-pay | Admitting: Family Medicine

## 2021-03-01 ENCOUNTER — Other Ambulatory Visit (INDEPENDENT_AMBULATORY_CARE_PROVIDER_SITE_OTHER): Payer: BC Managed Care – PPO

## 2021-03-01 ENCOUNTER — Other Ambulatory Visit: Payer: Self-pay

## 2021-03-01 DIAGNOSIS — E1169 Type 2 diabetes mellitus with other specified complication: Secondary | ICD-10-CM

## 2021-03-01 LAB — POCT GLYCOSYLATED HEMOGLOBIN (HGB A1C): Hemoglobin A1C: 6.5 % — AB (ref 4.0–5.6)

## 2021-03-01 NOTE — Progress Notes (Signed)
Pt requests POC A1c. Ordered.

## 2021-04-19 ENCOUNTER — Other Ambulatory Visit: Payer: Self-pay | Admitting: Cardiovascular Disease

## 2021-05-17 ENCOUNTER — Ambulatory Visit (INDEPENDENT_AMBULATORY_CARE_PROVIDER_SITE_OTHER): Payer: BC Managed Care – PPO | Admitting: Podiatry

## 2021-05-17 ENCOUNTER — Other Ambulatory Visit: Payer: Self-pay

## 2021-05-17 ENCOUNTER — Encounter: Payer: Self-pay | Admitting: Podiatry

## 2021-05-17 DIAGNOSIS — M79674 Pain in right toe(s): Secondary | ICD-10-CM

## 2021-05-17 DIAGNOSIS — M79675 Pain in left toe(s): Secondary | ICD-10-CM | POA: Diagnosis not present

## 2021-05-17 DIAGNOSIS — E1169 Type 2 diabetes mellitus with other specified complication: Secondary | ICD-10-CM

## 2021-05-17 DIAGNOSIS — B351 Tinea unguium: Secondary | ICD-10-CM | POA: Diagnosis not present

## 2021-05-17 NOTE — Progress Notes (Signed)
This patient returns to my office for at risk foot care.  This patient requires this care by a professional since this patient will be at risk due to having type 2 diabetes.    This patient is unable to cut nails himself since the patient cannot reach his nails.These nails are painful walking and wearing shoes.  This patient presents for at risk foot care today.  General Appearance  Alert, conversant and in no acute stress.  Vascular  Dorsalis pedis and posterior tibial  pulses are palpable  bilaterally.  Capillary return is within normal limits  bilaterally. Temperature is within normal limits  bilaterally.  Neurologic  Senn-Weinstein monofilament wire test within normal limits  bilaterally. Muscle power within normal limits bilaterally.  Nails Thick disfigured discolored nails with subungual debris  hallux nails bilaterally. No evidence of bacterial infection or drainage bilaterally.  Orthopedic  No limitations of motion  feet .  No crepitus or effusions noted.  No bony pathology or digital deformities noted.  Midfoot  DJD  B/L.  Skin  normotropic skin with no porokeratosis noted bilaterally.  No signs of infections or ulcers noted.     Onychomycosis  Pain in right toes  Pain in left toes  Consent was obtained for treatment procedures.   Mechanical debridement of nails 1-5  bilaterally performed with a nail nipper.  Filed with dremel without incident.    Return office visit    3 months                  Told patient to return for periodic foot care and evaluation due to potential at risk complications.   Brandyce Dimario DPM   

## 2021-05-28 ENCOUNTER — Encounter: Payer: Self-pay | Admitting: Pulmonary Disease

## 2021-05-28 ENCOUNTER — Other Ambulatory Visit: Payer: Self-pay

## 2021-05-28 ENCOUNTER — Ambulatory Visit (INDEPENDENT_AMBULATORY_CARE_PROVIDER_SITE_OTHER): Payer: BC Managed Care – PPO | Admitting: Pulmonary Disease

## 2021-05-28 VITALS — BP 120/80 | HR 80 | Temp 97.7°F | Ht 74.5 in | Wt 322.0 lb

## 2021-05-28 DIAGNOSIS — G4733 Obstructive sleep apnea (adult) (pediatric): Secondary | ICD-10-CM | POA: Diagnosis not present

## 2021-05-28 NOTE — Patient Instructions (Signed)
We will schedule you for an in lab sleep polysomnogram  We will update you with results as soon as reviewed  Continue weight loss efforts  Tentative follow-up in 3 to 4 months  Call with significant concernsSleep Apnea Sleep apnea affects breathing during sleep. It causes breathing to stop for 10 seconds or more, or to become shallow. People with sleep apnea usually snore loudly. It can also increase the risk of: Heart attack. Stroke. Being very overweight (obese). Diabetes. Heart failure. Irregular heartbeat. High blood pressure. The goal of treatment is to help you breathe normally again. What are the causes? The most common cause of this condition is a collapsed or blocked airway. There are three kinds of sleep apnea: Obstructive sleep apnea. This is caused by a blocked or collapsed airway. Central sleep apnea. This happens when the brain does not send the right signals to the muscles that control breathing. Mixed sleep apnea. This is a combination of obstructive and central sleep apnea. What increases the risk? Being overweight. Smoking. Having a small airway. Being older. Being male. Drinking alcohol. Taking medicines to calm yourself (sedatives or tranquilizers). Having family members with the condition. Having a tongue or tonsils that are larger than normal. What are the signs or symptoms? Trouble staying asleep. Loud snoring. Headaches in the morning. Waking up gasping. Dry mouth or sore throat in the morning. Being sleepy or tired during the day. If you are sleepy or tired during the day, you may also: Not be able to focus your mind (concentrate). Forget things. Get angry a lot and have mood swings. Feel sad (depressed). Have changes in your personality. Have less interest in sex, if you are male. Be unable to have an erection, if you are male. How is this treated?  Sleeping on your side. Using a medicine to get rid of mucus in your nose  (decongestant). Avoiding the use of alcohol, medicines to help you relax, or certain pain medicines (narcotics). Losing weight, if needed. Changing your diet. Quitting smoking. Using a machine to open your airway while you sleep, such as: An oral appliance. This is a mouthpiece that shifts your lower jaw forward. A CPAP device. This device blows air through a mask when you breathe out (exhale). An EPAP device. This has valves that you put in each nostril. A BIPAP device. This device blows air through a mask when you breathe in (inhale) and breathe out. Having surgery if other treatments do not work. Follow these instructions at home: Lifestyle Make changes that your doctor recommends. Eat a healthy diet. Lose weight if needed. Avoid alcohol, medicines to help you relax, and some pain medicines. Do not smoke or use any products that contain nicotine or tobacco. If you need help quitting, ask your doctor. General instructions Take over-the-counter and prescription medicines only as told by your doctor. If you were given a machine to use while you sleep, use it only as told by your doctor. If you are having surgery, make sure to tell your doctor you have sleep apnea. You may need to bring your device with you. Keep all follow-up visits. Contact a doctor if: The machine that you were given to use during sleep bothers you or does not seem to be working. You do not get better. You get worse. Get help right away if: Your chest hurts. You have trouble breathing in enough air. You have an uncomfortable feeling in your back, arms, or stomach. You have trouble talking. One side of your  body feels weak. A part of your face is hanging down. These symptoms may be an emergency. Get help right away. Call your local emergency services (911 in the [place] Do not wait to see if the symptoms will go away. Do not drive yourself to the hospital. Summary This condition affects breathing during  sleep. The most common cause is a collapsed or blocked airway. The goal of treatment is to help you breathe normally while you sleep. This information is not intended to replace advice given to you by your health care provider. Make sure you discuss any questions you have with your health care provider. Document Revised: 02/17/2021 Document Reviewed: 06/19/2020 Elsevier Patient Education  2022 [employer].
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
## 2021-05-31 ENCOUNTER — Other Ambulatory Visit: Payer: Self-pay

## 2021-05-31 ENCOUNTER — Encounter: Payer: Self-pay | Admitting: Physician Assistant

## 2021-05-31 ENCOUNTER — Ambulatory Visit (INDEPENDENT_AMBULATORY_CARE_PROVIDER_SITE_OTHER): Payer: BC Managed Care – PPO | Admitting: Physician Assistant

## 2021-05-31 VITALS — BP 128/82 | HR 89 | Ht 75.0 in | Wt 330.8 lb

## 2021-05-31 DIAGNOSIS — Z8679 Personal history of other diseases of the circulatory system: Secondary | ICD-10-CM | POA: Diagnosis not present

## 2021-05-31 DIAGNOSIS — E1169 Type 2 diabetes mellitus with other specified complication: Secondary | ICD-10-CM

## 2021-05-31 DIAGNOSIS — I10 Essential (primary) hypertension: Secondary | ICD-10-CM

## 2021-05-31 DIAGNOSIS — K219 Gastro-esophageal reflux disease without esophagitis: Secondary | ICD-10-CM

## 2021-05-31 DIAGNOSIS — R072 Precordial pain: Secondary | ICD-10-CM

## 2021-05-31 NOTE — Assessment & Plan Note (Signed)
No symptoms to suggest recurrence.  EKG does not show any ST elevation.
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
## 2021-05-31 NOTE — Assessment & Plan Note (Signed)
Recent A1c 6.5.  Continue follow-up with primary care.

## 2021-05-31 NOTE — Assessment & Plan Note (Signed)
We had a long discussion regarding weight loss.  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  This makes it difficult to lose weight.  He does admit to drinking a lot of soft drinks and sweet tea.  I asked him to try to eliminate these as it would likely help improve his weight significantly.  I also recommend he try to increase his activity is much as possible.

## 2021-05-31 NOTE — Assessment & Plan Note (Signed)
Blood pressure is well controlled on current therapy which includes olmesartan/amlodipine/HCTZ.

## 2021-05-31 NOTE — Assessment & Plan Note (Signed)
His chest pain sounds non-cardiac.  His EKG is unchanged.  I do not think he needs to repeat a stress test at this time.  He knows to contact us if his symptoms change.

## 2021-05-31 NOTE — Patient Instructions (Signed)
Medication Instructions:   HOLD your Pepcid while trying Prilosec.   TRY TAKING Over The Counter Prilosec one tablet by mouth ( 20 mg) daily X 2 weeks to see if this helps your Chest pain if it doesn't call PCP.   *If you need a refill on your cardiac medications before your next appointment, please call your pharmacy*   Lab Work:  -NONE  If you have labs (blood work) drawn today and your tests are completely normal, you will receive your results only by: MyChart Message (if you have MyChart) OR A paper copy in the mail If you have any lab test that is abnormal or we need to change your treatment, we will call you to review the results.   Testing/Procedures:  -NONE   Follow-Up: At [facility], you and your health needs are our priority.  As part of our continuing mission to provide you with exceptional heart care, we have created designated Provider Care Teams.  These Care Teams include your primary Cardiologist (physician) and Advanced Practice Providers (APPs -  Physician Assistants and Nurse Practitioners) who all work together to provide you with the care you need, when you need it.  We recommend signing up for the patient portal called "MyChart".  Sign up information is provided on this After Visit Summary.  MyChart is used to connect with patients for Virtual Visits (Telemedicine).  Patients are able to view lab/test results, encounter notes, upcoming appointments, etc.  Non-urgent messages can be sent to your provider as well.   To learn more about what you can do with MyChart, go to [email or web address].    Your next appointment:   1 year(s)  The format for your next appointment:   In Person  Provider:   Tonny Bollman, MD OR Tereso Newcomer, PA-C    Other Instructions  Your physician wants you to follow-up in: 1 year with Dr. Excell Seltzer or Tereso Newcomer, PA-C. You will receive a reminder letter in the mail two months in advance. If you don't receive a letter, please  call our office to schedule the follow-up appointment.

## 2021-05-31 NOTE — Assessment & Plan Note (Signed)
He has a lot chest pain that is not exertional. I have asked him to try Omeprazole 20 mg once daily (OTC) instead of Famotidine to see if it helps. If so, he may need referral to GI.  He can review with primary care.

## 2021-06-29 ENCOUNTER — Other Ambulatory Visit: Payer: Self-pay

## 2021-06-29 ENCOUNTER — Ambulatory Visit (HOSPITAL_BASED_OUTPATIENT_CLINIC_OR_DEPARTMENT_OTHER): Payer: BC Managed Care – PPO | Attending: Pulmonary Disease | Admitting: Pulmonary Disease

## 2021-06-29 DIAGNOSIS — I493 Ventricular premature depolarization: Secondary | ICD-10-CM | POA: Diagnosis not present

## 2021-06-29 DIAGNOSIS — G4733 Obstructive sleep apnea (adult) (pediatric): Secondary | ICD-10-CM

## 2021-06-29 DIAGNOSIS — G4736 Sleep related hypoventilation in conditions classified elsewhere: Secondary | ICD-10-CM | POA: Diagnosis not present

## 2021-07-03 ENCOUNTER — Encounter: Payer: Self-pay | Admitting: Pulmonary Disease

## 2021-07-03 ENCOUNTER — Encounter: Payer: Self-pay | Admitting: Family Medicine

## 2021-07-04 ENCOUNTER — Telehealth: Payer: Self-pay | Admitting: Pulmonary Disease

## 2021-07-04 NOTE — Telephone Encounter (Signed)
Call patient  Sleep study result  Date of study: 06/29/2021  Impression: Moderate obstructive sleep apnea Moderate oxygen desaturations  Recommendation:  DME referral  Recommend CPAP therapy for moderate obstructive sleep apnea  Auto titrating CPAP with pressure settings of 5-15 will be appropriate  Encourage weight loss measures  Follow-up in the office 4 to 6 weeks following initiation of treatment

## 2021-07-04 NOTE — Procedures (Signed)
POLYSOMNOGRAPHY  Last, First: Letroy, Vazguez MRN: [id number] Gender: Male Age (years): 50 Weight (lbs): 325 DOB: 1970/11/09 BMI: 41 Primary Care: No PCP Epworth Score: 8 Referring: Tomma Lightning MD Technician: Shelah Lewandowsky Interpreting: Tomma Lightning MD Study Type: NPSG Ordered Study Type: NPSG Study date: 06/29/2021 Location: [facility] CLINICAL INFORMATION Jonathan Compton is a 50 year old Male and was referred to the sleep center for evaluation of G47.33 OSA: Adult and Pediatric (327.23). Indications include Daytime Fatigue, Diabetes, Hypertension, Obesity, Snoring.   Most recent polysomnogram dated 02/17/2018 revealed an AHI of 22.9/h. MEDICATIONS Patient self administered medications include: METFORMIN, Tribenzor, FAMOTIDINE. Medications administered during study include No sleep medicine administered.  SLEEP STUDY TECHNIQUE A multi-channel overnight Polysomnography study was performed. The channels recorded and monitored were central and occipital EEG, electrooculogram (EOG), submentalis EMG (chin), nasal and oral airflow, thoracic and abdominal wall motion, anterior tibialis EMG, snore microphone, electrocardiogram, and a pulse oximetry. TECHNICIAN COMMENTS Comments added by Technician: Patient talked in his/her sleep. Comments added by Scorer: N/A SLEEP ARCHITECTURE The study was initiated at 10:52:37 PM and terminated at 5:00:27 AM. The total recorded time was 367.8 minutes. EEG confirmed total sleep time was 308.5 minutes yielding a sleep efficiency of 83.9%%. Sleep onset after lights out was 5.6 minutes with a REM latency of 91.0 minutes. The patient spent 7.5%% of the night in stage N1 sleep, 74.1%% in stage N2 sleep, 0.0%% in stage N3 and 18.5% in REM. Wake after sleep onset (WASO) was 53.8 minutes. The Arousal Index was 20.6/hour. RESPIRATORY PARAMETERS There were a total of 91 respiratory disturbances out of which 0 were apneas ( 0 obstructive, 0 mixed, 0  central) and 91 hypopneas. The apnea/hypopnea index (AHI) was 17.7 events/hour. The central sleep apnea index was 0 events/hour. The REM AHI was 58.9 events/hour and NREM AHI was 8.6 events/hour. The supine AHI was 58.5 events/hour and the non supine AHI was 11.6 supine during 12.97% of sleep. Respiratory disturbances were associated with oxygen desaturation down to a nadir of 79.0% during sleep. The mean oxygen saturation during the study was 92.5%. The cumulative time under 88% oxygen saturation was 5.5 minutes.  LEG MOVEMENT DATA The total leg movements were 0 with a resulting leg movement index of 0.0/hr .Associated arousal with leg movement index was 0.0/hr.  CARDIAC DATA The underlying cardiac rhythm was most consistent with sinus rhythm. Mean heart rate during sleep was 72.2 bpm. Additional rhythm abnormalities include PVCs.  IMPRESSIONS - Moderate Obstructive Sleep apnea(OSA) - Electrocardiographic data showed presence of PVCs. - Moderate Oxygen Desaturation - The patient snored with moderate snoring volume. - No significant periodic leg movements(PLMs) during sleep. However, no significant associated arousals.  DIAGNOSIS - Obstructive Sleep Apnea (G47.33) - Nocturnal Hypoxemia (G47.36)  RECOMMENDATIONS - Therapeutic CPAP titration to determine optimal pressure required to alleviate sleep disordered breathing. - Auto titrating CPAP may be considered as an option of treatment with settings of 5 - 15 - Positional therapy avoiding supine position during sleep. - Avoid alcohol, sedatives and other CNS depressants that may worsen sleep apnea and disrupt normal sleep architecture. - Sleep hygiene should be reviewed to assess factors that may improve sleep quality. - Weight management and regular exercise should be initiated or continued.  [Electronically signed] 07/04/2021 02:28 PM  Virl Diamond MD NPI: [id number]

## 2021-07-05 ENCOUNTER — Encounter: Payer: Self-pay | Admitting: Family Medicine

## 2021-07-05 MED ORDER — OLMESARTAN-AMLODIPINE-HCTZ 40-10-25 MG PO TABS: 1.0000 | ORAL_TABLET | Freq: Every day | ORAL | 0 refills | Status: DC

## 2021-07-05 NOTE — Telephone Encounter (Signed)
E-scribed refill to [facility].  Plz schedule lab and cpe visits.

## 2021-07-05 NOTE — Telephone Encounter (Signed)
I called the patient and he does not want to start a CPAP machine at this time. He will call back at a later date to make a follow up. Nothing further needed.

## 2021-07-05 NOTE — Telephone Encounter (Signed)
Called patient and sent mychart.

## 2021-07-09 LAB — HM DIABETES EYE EXAM

## 2021-08-23 ENCOUNTER — Ambulatory Visit: Payer: 59 | Admitting: Podiatry

## 2021-09-10 ENCOUNTER — Other Ambulatory Visit: Payer: Self-pay

## 2021-09-10 ENCOUNTER — Ambulatory Visit (INDEPENDENT_AMBULATORY_CARE_PROVIDER_SITE_OTHER): Payer: 59 | Admitting: Podiatry

## 2021-09-10 ENCOUNTER — Encounter: Payer: Self-pay | Admitting: Podiatry

## 2021-09-10 DIAGNOSIS — B351 Tinea unguium: Secondary | ICD-10-CM | POA: Diagnosis not present

## 2021-09-10 DIAGNOSIS — E1169 Type 2 diabetes mellitus with other specified complication: Secondary | ICD-10-CM | POA: Diagnosis not present

## 2021-09-10 DIAGNOSIS — M79674 Pain in right toe(s): Secondary | ICD-10-CM

## 2021-09-10 DIAGNOSIS — M79675 Pain in left toe(s): Secondary | ICD-10-CM

## 2021-09-10 NOTE — Progress Notes (Signed)
This patient returns to my office for at risk foot care.  This patient requires this care by a professional since this patient will be at risk due to having type 2 diabetes.    This patient is unable to cut nails himself since the patient cannot reach his nails.These nails are painful walking and wearing shoes.  This patient presents for at risk foot care today.  General Appearance  Alert, conversant and in no acute stress.  Vascular  Dorsalis pedis and posterior tibial  pulses are palpable  bilaterally.  Capillary return is within normal limits  bilaterally. Temperature is within normal limits  bilaterally.  Neurologic  Senn-Weinstein monofilament wire test within normal limits  bilaterally. Muscle power within normal limits bilaterally.  Nails Thick disfigured discolored nails with subungual debris  hallux nails bilaterally. No evidence of bacterial infection or drainage bilaterally.  Orthopedic  No limitations of motion  feet .  No crepitus or effusions noted.  No bony pathology or digital deformities noted.  Midfoot  DJD  B/L.  Skin  normotropic skin with no porokeratosis noted bilaterally.  No signs of infections or ulcers noted.     Onychomycosis  Pain in right toes  Pain in left toes  Consent was obtained for treatment procedures.   Mechanical debridement of nails 1-5  bilaterally performed with a nail nipper.  Filed with dremel without incident.    Return office visit    3 months                  Told patient to return for periodic foot care and evaluation due to potential at risk complications.   Darcelle Herrada DPM   

## 2021-09-11 ENCOUNTER — Other Ambulatory Visit: Payer: Self-pay | Admitting: Family Medicine

## 2021-09-23 ENCOUNTER — Other Ambulatory Visit: Payer: Self-pay | Admitting: Family Medicine

## 2021-09-23 MED ORDER — OLMESARTAN-AMLODIPINE-HCTZ 40-10-25 MG PO TABS: 1.0000 | ORAL_TABLET | Freq: Every day | ORAL | 0 refills | Status: DC

## 2021-09-24 NOTE — Telephone Encounter (Signed)
Message from pharmacy rx not covered.  Do you want me to submit PA or do you want him to try something else? ?

## 2021-09-27 ENCOUNTER — Other Ambulatory Visit: Payer: Self-pay | Admitting: Physician Assistant

## 2021-09-27 ENCOUNTER — Encounter: Payer: Self-pay | Admitting: Cardiovascular Disease

## 2021-09-27 MED ORDER — OLMESARTAN-AMLODIPINE-HCTZ 40-10-25 MG PO TABS: 1.0000 | ORAL_TABLET | Freq: Every day | ORAL | 2 refills | Status: DC

## 2021-09-27 NOTE — Telephone Encounter (Signed)
Spoke with pt relaying Dr. Timoteo Expose message.  Pt verbalizes understanding and will call cardiology.  ?

## 2021-09-27 NOTE — Telephone Encounter (Signed)
Sent refill in for Tribenzor -pt seen in clinic in November with recommendation to f/u in 1 year. ?

## 2021-09-27 NOTE — Telephone Encounter (Signed)
Request needs to go through cardiology who prescribed it. ?I refilled once and advised he f/u with cardiology clinic for further refills.  ?

## 2021-10-25 ENCOUNTER — Ambulatory Visit: Payer: BC Managed Care – PPO | Admitting: Family Medicine

## 2021-11-08 ENCOUNTER — Encounter: Payer: Self-pay | Admitting: Family Medicine

## 2021-11-08 ENCOUNTER — Ambulatory Visit (INDEPENDENT_AMBULATORY_CARE_PROVIDER_SITE_OTHER): Payer: 59 | Admitting: Family Medicine

## 2021-11-08 VITALS — BP 132/84 | HR 84 | Temp 97.8°F | Ht 74.5 in | Wt 334.0 lb

## 2021-11-08 DIAGNOSIS — L918 Other hypertrophic disorders of the skin: Secondary | ICD-10-CM

## 2021-11-08 DIAGNOSIS — E1169 Type 2 diabetes mellitus with other specified complication: Secondary | ICD-10-CM | POA: Diagnosis not present

## 2021-11-08 DIAGNOSIS — Z1159 Encounter for screening for other viral diseases: Secondary | ICD-10-CM | POA: Diagnosis not present

## 2021-11-08 DIAGNOSIS — I10 Essential (primary) hypertension: Secondary | ICD-10-CM | POA: Diagnosis not present

## 2021-11-08 DIAGNOSIS — Z125 Encounter for screening for malignant neoplasm of prostate: Secondary | ICD-10-CM

## 2021-11-08 DIAGNOSIS — K219 Gastro-esophageal reflux disease without esophagitis: Secondary | ICD-10-CM

## 2021-11-08 DIAGNOSIS — E785 Hyperlipidemia, unspecified: Secondary | ICD-10-CM

## 2021-11-08 DIAGNOSIS — Z0000 Encounter for general adult medical examination without abnormal findings: Secondary | ICD-10-CM

## 2021-11-08 DIAGNOSIS — G4733 Obstructive sleep apnea (adult) (pediatric): Secondary | ICD-10-CM

## 2021-11-08 DIAGNOSIS — Z8679 Personal history of other diseases of the circulatory system: Secondary | ICD-10-CM

## 2021-11-08 DIAGNOSIS — E66813 Obesity, class 3: Secondary | ICD-10-CM

## 2021-11-08 DIAGNOSIS — H3581 Retinal edema: Secondary | ICD-10-CM

## 2021-11-08 LAB — LIPID PANEL
Cholesterol: 144 mg/dL (ref 0–200)
HDL: 29.6 mg/dL — ABNORMAL LOW (ref >39.00)
LDL Cholesterol: 79 mg/dL (ref 0–99)
NonHDL: 114.08
Total CHOL/HDL Ratio: 5
Triglycerides: 173 mg/dL — ABNORMAL HIGH (ref 0.0–149.0)
VLDL: 34.6 mg/dL (ref 0.0–40.0)

## 2021-11-08 LAB — COMPREHENSIVE METABOLIC PANEL
ALT: 22 U/L (ref 0–53)
AST: 19 U/L (ref 0–37)
Albumin: 4.2 g/dL (ref 3.5–5.2)
Alkaline Phosphatase: 91 U/L (ref 39–117)
BUN: 18 mg/dL (ref 6–23)
CO2: 29 mEq/L (ref 19–32)
Calcium: 9.1 mg/dL (ref 8.4–10.5)
Chloride: 104 mEq/L (ref 96–112)
Creatinine, Ser: 0.99 mg/dL (ref 0.40–1.50)
GFR: 88.16 mL/min (ref >60.00)
Glucose, Bld: 137 mg/dL — ABNORMAL HIGH (ref 70–99)
Potassium: 3.8 mEq/L (ref 3.5–5.1)
Sodium: 141 mEq/L (ref 135–145)
Total Bilirubin: 0.6 mg/dL (ref 0.2–1.2)
Total Protein: 7 g/dL (ref 6.0–8.3)

## 2021-11-08 LAB — HEMOGLOBIN A1C: Hgb A1c MFr Bld: 6 % (ref 4.6–6.5)

## 2021-11-08 LAB — PSA: PSA: 0.64 ng/mL (ref 0.10–4.00)

## 2021-11-08 NOTE — Patient Instructions (Addendum)
Labs today  ?We will request latest diabetic eye exam from [facility] on [address].  ?[facility] (06/2020 - Zostavax vs Shingrix) and let us know.  ?Skin tags can be associated with diabetes and insulin resistance. We will refer you to skin doctor for further evaluation.  ?Good to see you today ?Return as needed or in 1 year for next physical.  ?Schedule diabetes check with Dr Talmage Nap. ? ?Health Maintenance, Male ?Adopting a healthy lifestyle and getting preventive care are important in promoting health and wellness. Ask your health care provider about: ?The right schedule for you to have regular tests and exams. ?Things you can do on your own to prevent diseases and keep yourself healthy. ?What should I know about diet, weight, and exercise? ?Eat a healthy diet ? ?Eat a diet that includes plenty of vegetables, fruits, low-fat dairy products, and lean protein. ?Do not eat a lot of foods that are high in solid fats, added sugars, or sodium. ?Maintain a healthy weight ?Body mass index (BMI) is a measurement that can be used to identify possible weight problems. It estimates body fat based on height and weight. Your health care provider can help determine your BMI and help you achieve or maintain a healthy weight. ?Get regular exercise ?Get regular exercise. This is one of the most important things you can do for your health. Most adults should: ?Exercise for at least 150 minutes each week. The exercise should increase your heart rate and make you sweat (moderate-intensity exercise). ?Do strengthening exercises at least twice a week. This is in addition to the moderate-intensity exercise. ?Spend less time sitting. Even light physical activity can be beneficial. ?Watch cholesterol and blood lipids ?Have your blood tested for lipids and cholesterol at 52 years of age, then have this test every 5 years. ?You may need to have your cholesterol levels checked more often  if: ?Your lipid or cholesterol levels are high. ?You are older than 53 years of age. ?You are at high risk for heart disease. ?What should I know about cancer screening? ?Many types of cancers can be detected early and may often be prevented. Depending on your health history and family history, you may need to have cancer screening at various ages. This may include screening for: ?Colorectal cancer. ?Prostate cancer. ?Skin cancer. ?Lung cancer. ?What should I know about heart disease, diabetes, and high blood pressure? ?Blood pressure and heart disease ?High blood pressure causes heart disease and increases the risk of stroke. This is more likely to develop in people who have high blood pressure readings or are overweight. ?Talk with your health care provider about your target blood pressure readings. ?Have your blood pressure checked: ?Every 3-5 years if you are 23-31 years of age. ?Every year if you are 48 years old or older. ?If you are between the ages of 69 and 73 and are a current or former smoker, ask your health care provider if you should have a one-time screening for abdominal aortic aneurysm (AAA). ?Diabetes ?Have regular diabetes screenings. This checks your fasting blood sugar level. Have the screening done: ?Once every three years after age 41 if you are at a normal weight and have a low risk for diabetes. ?More often and at a younger age if you are overweight or have a high risk for diabetes. ?What should I know about preventing infection? ?Hepatitis B ?If you have a higher risk for hepatitis B, you should  be screened for this virus. Talk with your health care provider to find out if you are at risk for hepatitis B infection. ?Hepatitis C ?Blood testing is recommended for: ?Everyone born from [birth year] through 1966. ?Anyone with known risk factors for hepatitis C. ?Sexually transmitted infections (STIs) ?You should be screened each year for STIs, including gonorrhea and chlamydia, if: ?You are sexually  active and are younger than 52 years of age. ?You are older than 52 years of age and your health care provider tells you that you are at risk for this type of infection. ?Your sexual activity has changed since you were last screened, and you are at increased risk for chlamydia or gonorrhea. Ask your health care provider if you are at risk. ?Ask your health care provider about whether you are at high risk for HIV. Your health care provider may recommend a prescription medicine to help prevent HIV infection. If you choose to take medicine to prevent HIV, you should first get tested for HIV. You should then be tested every 3 months for as long as you are taking the medicine. ?Follow these instructions at home: ?Alcohol use ?Do not drink alcohol if your health care provider tells you not to drink. ?If you drink alcohol: ?Limit how much you have to 0-2 drinks a day. ?Know how much alcohol is in your drink. In the [place] one drink equals one 12 oz bottle of beer (355 mL), one 5 oz glass of wine (148 mL), or one 1? oz glass of hard liquor (44 mL). ?Lifestyle ?Do not use any products that contain nicotine or tobacco. These products include cigarettes, chewing tobacco, and vaping devices, such as e-cigarettes. If you need help quitting, ask your health care provider. ?Do not use street drugs. ?Do not share needles. ?Ask your health care provider for help if you need support or information about quitting drugs. ?General instructions ?Schedule regular health, dental, and eye exams. ?Stay current with your vaccines. ?Tell your health care provider if: ?You often feel depressed. ?You have ever been abused or do not feel safe at home. ?Summary ?Adopting a healthy lifestyle and getting preventive care are important in promoting health and wellness. ?Follow your health care provider's instructions about healthy diet, exercising, and getting tested or screened for diseases. ?Follow your health care provider's instructions on  monitoring your cholesterol and blood pressure. ?This information is not intended to replace advice given to you by your health care provider. Make sure you discuss any questions you have with your health care provider. ?Document Revised: 11/30/2020 Document Reviewed: 11/30/2020 ?Elsevier Patient Education ? 2023 [employer] ? ?

## 2021-11-08 NOTE — Progress Notes (Signed)
? ? Patient ID: Jonathan Compton, male    DOB: March 24, 1971, 51 y.o.   MRN: [id number] ? ?This visit was conducted in person. ? ?BP 132/84   Pulse 84   Temp 97.8 ?F (36.6 ?C) (Temporal)   Ht 6' 2.5" (1.892 m)   Wt (!) 334 lb (151.5 kg)   BMI 42.31 kg/m?   ? ?CC: CPE ?Subjective:  ? ?HPI: ?Jonathan Compton is a 51 y.o. male presenting on 11/08/2021 for Annual Exam (Wants to discuss glimepiride. ) ? ? ?Recent work related injury in the past month - going to see ortho this afternoon. ? ?DM - followed by endo Dr Talmage Nap. Due for follow up. He has Freestyle Libre 3 110s fasting. He is taking metformin 500mg  bid. Tried juice diet, then plain water - sugars dropped.  ?Lab Results  ?Component Value Date  ? HGBA1C 6.5 (A) 03/01/2021  ?  ? ?HTN - stable period on tribenzor 40/10/25mg  daily.  ? ?OSA - trouble tolerating CPAP, not currently using.  ? ?Saw cardiology ([facility]) latest 05/2021 in h/o pericarditis, s/p myoview stress test 2021.  ? ?GERD - managed with pepcid 20mg  bid and occasional dissolved sodium bicarb. Hasn't tried PPI.  ?  ?Preventative: ?COLONOSCOPY 09/2015 WNL ([facility])  ?ESOPHAGOGASTRODUODENOSCOPY 09/2015 WNL  ?Prostate cancer screening - father with prostate cancer dx 10 currently undergoing radiation. Continue yearly check. No nocturia, weakening stream ?Lung cancer screening - not eligible. ?Flu - declines  ?COVID vaccine - J&J 11/2019, booster x1  ?Pneumovax 10/2017 ?Tdap 2014  ?Zostavax 06/2020 ?Shingrix - discussed ?Seat belt use discussed  ?Sunscreen use discussed. No changing moles on skin.  ?Sleep - averaging hours/night  ?Ex smoker - quit 2007  ?Alcohol - none  ?Dentist yearly  ?Eye exam yearly  ?  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   ? ?Relevant past medical, surgical, family and social history reviewed and updated  as indicated. Interim medical history since our last visit reviewed. ?Allergies and medications reviewed and updated. ?Outpatient Medications Prior to Visit  ?Medication Sig Dispense Refill  ? Blood Pressure Monitoring (BLOOD PRESSURE CUFF) MISC Use as directed to check blood pressures daily or as needed. XL cuff 1 each 0  ? famotidine (PEPCID) 20 MG tablet Take 1 tablet (20 mg total) by mouth 2 (two) times daily. 180 tablet 3  ? glimepiride (AMARYL) 2 MG tablet Take 2 mg by mouth daily.    ? Olmesartan-amLODIPine-HCTZ 40-10-25 MG TABS Take 1 tablet by mouth daily. 90 tablet 2  ? Continuous Blood Gluc Receiver (FREESTYLE LIBRE 14 DAY READER) DEVI See admin instructions.    ? Continuous Blood Gluc Sensor (FREESTYLE LIBRE 14 DAY SENSOR) MISC See admin instructions.    ? metFORMIN (GLUCOPHAGE) 500 MG tablet TAKE 1 TABLET BY MOUTH 2 TIMES DAILY WITH A MEAL. 180 tablet 3  ? metFORMIN (GLUCOPHAGE) 500 MG tablet Take 1 tablet (500 mg total) by mouth 2 (two) times daily with a meal. For diabetes    ? ?Facility-Administered Medications Prior to Visit  ?Medication Dose Route Frequency Provider Last Rate Last Admin  ? fentaNYL (SUBLIMAZE) injection    PRN [employer], MD   25 mcg at 10/05/16 1018  ? heparin infusion 2 units/mL in 0.9 % sodium chloride    Continuous PRN Tonny Bollman, MD  1,000 mL at 10/05/16 1043  ? heparin injection    PRN Tonny Bollman, MD   5,000 Units at 10/05/16 1023  ? iopamidol (ISOVUE-370) 76 % injection    PRN Tonny Bollman, MD   90 mL at 10/05/16 1043  ? lidocaine (PF) (XYLOCAINE) 1 % injection    PRN Tonny Bollman, MD   2 mL at 10/05/16 1020  ? midazolam (VERSED) injection    PRN Tonny Bollman, MD   1 mg at 10/05/16 1018  ? Radial Cocktail/Verapamil only    PRN Tonny Bollman, MD   10 mL at 10/05/16 1022  ? 0.9 %  sodium chloride infusion    Continuous PRN Tonny Bollman, MD 100 mL/hr at 10/05/16 1017 100 mL/hr at 10/05/16 1017  ?  ? ?Per HPI unless specifically indicated in ROS  section below ?Review of Systems  ?Constitutional:  Negative for activity change, appetite change, chills, fatigue, fever and unexpected weight change.  ?HENT:  Negative for hearing loss.   ?Eyes:  Negative for visual disturbance.  ?Respiratory:  Negative for cough, chest tightness, shortness of breath and wheezing.   ?Cardiovascular:  Negative for chest pain, palpitations and leg swelling.  ?Gastrointestinal:  Negative for abdominal distention, abdominal pain, blood in stool, constipation, diarrhea, nausea and vomiting.  ?Genitourinary:  Negative for difficulty urinating and hematuria.  ?Musculoskeletal:  Negative for arthralgias, myalgias and neck pain.  ?Skin:  Negative for rash.  ?Neurological:  Negative for dizziness, seizures, syncope and headaches.  ?Hematological:  Negative for adenopathy. Does not bruise/bleed easily.  ?Psychiatric/Behavioral:  Negative for dysphoric mood. The patient is not nervous/anxious.   ? ?Objective:  ?BP 132/84   Pulse 84   Temp 97.8 ?F (36.6 ?C) (Temporal)   Ht 6' 2.5" (1.892 m)   Wt (!) 334 lb (151.5 kg)   BMI 42.31 kg/m?   ?Wt Readings from Last 3 Encounters:  ?11/08/21 (!) 334 lb (151.5 kg)  ?06/29/21 (!) 325 lb (147.4 kg)  ?05/31/21 (!) 330 lb 12.8 oz (150 kg)  ?  ?  ?Physical Exam ?Vitals and nursing note reviewed.  ?Constitutional:   ?   General: He is not in acute distress. ?   Appearance: Normal appearance. He is well-developed. He is not ill-appearing.  ?HENT:  ?   Head: Normocephalic and atraumatic.  ?   Right Ear: Hearing, tympanic membrane, ear canal and external ear normal.  ?   Left Ear: Hearing, tympanic membrane, ear canal and external ear normal.  ?Eyes:  ?   General: No scleral icterus. ?   Extraocular Movements: Extraocular movements intact.  ?   Conjunctiva/sclera: Conjunctivae normal.  ?   Pupils: Pupils are equal, round, and reactive to light.  ?Neck:  ?   Thyroid: No thyroid mass or thyromegaly.  ?Cardiovascular:  ?   Rate and Rhythm: Normal rate and  regular rhythm.  ?   Pulses: Normal pulses.     ?     Radial pulses are 2+ on the right side and 2+ on the left side.  ?   Heart sounds: Normal heart sounds. No murmur heard. ?Pulmonary:  ?   Effort: Pulmonary effort is normal. No respiratory distress.  ?   Breath sounds: Normal breath sounds. No wheezing, rhonchi or rales.  ?Abdominal:  ?   General: Bowel sounds are normal. There is no distension.  ?   Palpations: Abdomen is soft. There is no mass.  ?   Tenderness: There is no abdominal tenderness. There is no  guarding or rebound.  ?   Hernia: No hernia is present.  ?Musculoskeletal:     ?   General: Normal range of motion.  ?   Cervical back: Normal range of motion and neck supple.  ?   Right lower leg: No edema.  ?   Left lower leg: No edema.  ?Lymphadenopathy:  ?   Cervical: No cervical adenopathy.  ?Skin: ?   General: Skin is warm and dry.  ?   Findings: No rash.  ?Neurological:  ?   General: No focal deficit present.  ?   Mental Status: He is alert and oriented to person, place, and time.  ?Psychiatric:     ?   Mood and Affect: Mood normal.     ?   Behavior: Behavior normal.     ?   Thought Content: Thought content normal.     ?   Judgment: Judgment normal.  ? ?   ?Results for orders placed or performed in visit on 03/01/21  ?POCT glycosylated hemoglobin (Hb A1C)  ?Result Value Ref Range  ? Hemoglobin A1C 6.5 (A) 4.0 - 5.6 %  ? HbA1c POC (<> result, manual entry)    ? HbA1c, POC (prediabetic range)    ? HbA1c, POC (controlled diabetic range)    ? ? ?Assessment & Plan:  ? ?Problem List Items Addressed This Visit   ? ? Healthcare maintenance - Primary (Chronic)  ?  Preventative protocols reviewed and updated unless pt declined. ?Discussed healthy diet and lifestyle.  ? ?  ?  ? Essential hypertension  ?  Chronic, stable on tribenzor through cardiology.  ? ?  ?  ? Obesity, Class III, BMI 40-49.9 (morbid obesity) (HCC)  ?  Encouraged healthy diet and lifestyle choices to affect sustainable weight loss.  ? ?  ?  ?  Relevant Medications  ? metFORMIN (GLUCOPHAGE) 500 MG tablet  ? GERD (gastroesophageal reflux disease)  ?  Chronic, stable on OTC pepcid BID.  ? ?  ?  ? Type 2 diabetes mellitus with other specified complication (HCC)

## 2021-11-08 NOTE — Telephone Encounter (Signed)
Updated pt's chart.  

## 2021-11-08 NOTE — Assessment & Plan Note (Signed)
Encouraged healthy diet and lifestyle choices to affect sustainable weight loss.  ?

## 2021-11-08 NOTE — Assessment & Plan Note (Signed)
Preventative protocols reviewed and updated unless pt declined. Discussed healthy diet and lifestyle.  

## 2021-11-08 NOTE — Assessment & Plan Note (Addendum)
Not regularly using CPAP due to poor tolerance.  ?

## 2021-11-08 NOTE — Assessment & Plan Note (Addendum)
Chronic, not on statin - will likely recommend pending results given diabetic indication.  ?The 10-year ASCVD risk score (Arnett DK, et al., 2019) is: 18.8% ?  Values used to calculate the score: ?    Age: 51 years ?    Sex: Male ?    Is Non-Hispanic African American: Yes ?    Diabetic: Yes ?    Tobacco smoker: No ?    Systolic Blood Pressure: 132 mmHg ?    Is BP treated: Yes ?    HDL Cholesterol: 28.3 mg/dL ?    Total Cholesterol: 139 mg/dL  ?

## 2021-11-08 NOTE — Assessment & Plan Note (Addendum)
Chronic, stable on OTC pepcid BID.  ?

## 2021-11-08 NOTE — Assessment & Plan Note (Addendum)
Chronic, stable based on CGM readings. Update A1c today.  ?Appreciate endo care.  ?

## 2021-11-08 NOTE — Assessment & Plan Note (Signed)
Chronic, stable on tribenzor through cardiology.  ?

## 2021-11-09 ENCOUNTER — Encounter: Payer: Self-pay | Admitting: Family Medicine

## 2021-11-09 DIAGNOSIS — H3581 Retinal edema: Secondary | ICD-10-CM | POA: Insufficient documentation

## 2021-11-09 LAB — HEPATITIS C ANTIBODY
Hepatitis C Ab: NONREACTIVE
SIGNAL TO CUT-OFF: 0.19 (ref <1.00)

## 2021-11-09 NOTE — Telephone Encounter (Signed)
Updated pt's chart with recent 3 vaccines.  ?

## 2021-11-17 ENCOUNTER — Encounter: Payer: Self-pay | Admitting: Family Medicine

## 2021-11-24 ENCOUNTER — Encounter: Payer: Self-pay | Admitting: Family Medicine

## 2021-11-24 NOTE — Addendum Note (Signed)
Addended by: Eustaquio Boyden on: 11/24/2021 05:06 PM ? ? Modules accepted: Orders ? ?

## 2021-12-07 ENCOUNTER — Ambulatory Visit: Payer: 59

## 2021-12-24 ENCOUNTER — Other Ambulatory Visit: Payer: Self-pay | Admitting: Family Medicine

## 2021-12-24 NOTE — Telephone Encounter (Signed)
Looks like meds prescribed by cardiology.    Spoke with pt asking about refill requests and cardiology prescribing.  Pt states he was supposed to be on Tribenzor (olmesartan-amlodipine-HCTZ).  However, [facility] and needed to split the meds into 2 rxs.  But even so, pt confirms his cardiologists prescribes these meds.  States [facility] will inform [facility].  Notified pt I will deny requests.  Verbalizes understanding.   Denied refills.

## 2022-01-22 ENCOUNTER — Other Ambulatory Visit: Payer: Self-pay | Admitting: Family Medicine

## 2022-01-26 ENCOUNTER — Telehealth: Payer: Self-pay | Admitting: Family Medicine

## 2022-01-26 NOTE — Telephone Encounter (Signed)
Rx sent to [facility] on 01/24/22, #30/0.

## 2022-01-26 NOTE — Telephone Encounter (Signed)
  Encourage patient to contact the pharmacy for refills or they can request refills through [facility]  LAST APPOINTMENT DATE:  Please schedule appointment if longer than 1 year  NEXT APPOINTMENT DATE:  MEDICATION:olmesartan-hydrochlorothiazide (BENICAR HCT) 40-25 MG tablet   amLODipine (NORVASC) 10 MG tablet  Is the patient out of medication?   [id number] [place],  Let patient know to contact pharmacy at the end of the day to make sure medication is ready.  Please notify patient to allow 48-72 hours to process  CLINICAL FILLS OUT ALL BELOW:   LAST REFILL:  QTY:  REFILL DATE:    OTHER COMMENTS:    Okay for refill?  Please advise

## 2022-02-15 ENCOUNTER — Encounter: Payer: Self-pay | Admitting: Family Medicine

## 2022-02-15 DIAGNOSIS — E1169 Type 2 diabetes mellitus with other specified complication: Secondary | ICD-10-CM

## 2022-02-15 NOTE — Telephone Encounter (Signed)
Is patient due a diabetic follow-up.

## 2022-02-19 ENCOUNTER — Other Ambulatory Visit: Payer: Self-pay | Admitting: Family Medicine

## 2022-02-21 ENCOUNTER — Other Ambulatory Visit: Payer: Self-pay | Admitting: Family Medicine

## 2022-02-21 ENCOUNTER — Other Ambulatory Visit (INDEPENDENT_AMBULATORY_CARE_PROVIDER_SITE_OTHER): Payer: 59

## 2022-02-21 DIAGNOSIS — E1169 Type 2 diabetes mellitus with other specified complication: Secondary | ICD-10-CM | POA: Diagnosis not present

## 2022-02-21 LAB — HEMOGLOBIN A1C: Hgb A1c MFr Bld: 6.2 % (ref 4.6–6.5)

## 2022-02-22 NOTE — Telephone Encounter (Signed)
CPE on 03/16/22.   E-scribed 90-day rx,

## 2022-03-05 ENCOUNTER — Other Ambulatory Visit: Payer: Self-pay | Admitting: Family Medicine

## 2022-03-05 DIAGNOSIS — E1169 Type 2 diabetes mellitus with other specified complication: Secondary | ICD-10-CM

## 2022-03-05 DIAGNOSIS — Z125 Encounter for screening for malignant neoplasm of prostate: Secondary | ICD-10-CM

## 2022-03-05 DIAGNOSIS — I10 Essential (primary) hypertension: Secondary | ICD-10-CM

## 2022-03-09 ENCOUNTER — Other Ambulatory Visit: Payer: 59

## 2022-03-09 ENCOUNTER — Telehealth: Payer: Self-pay

## 2022-03-09 DIAGNOSIS — E1169 Type 2 diabetes mellitus with other specified complication: Secondary | ICD-10-CM

## 2022-03-09 DIAGNOSIS — I10 Essential (primary) hypertension: Secondary | ICD-10-CM

## 2022-03-09 MED ORDER — METFORMIN HCL 500 MG PO TABS: 500.0000 mg | ORAL_TABLET | Freq: Two times a day (BID) | ORAL | 1 refills | Status: DC

## 2022-03-09 MED ORDER — AMLODIPINE BESYLATE 10 MG PO TABS: ORAL_TABLET | ORAL | 1 refills | Status: DC

## 2022-03-09 MED ORDER — GLIMEPIRIDE 2 MG PO TABS: 2.0000 mg | ORAL_TABLET | Freq: Every day | ORAL | 1 refills | Status: DC

## 2022-03-09 MED ORDER — OLMESARTAN MEDOXOMIL-HCTZ 40-25 MG PO TABS: ORAL_TABLET | ORAL | 1 refills | Status: DC

## 2022-03-09 MED ORDER — FREESTYLE LIBRE 3 SENSOR MISC: 1 refills | Status: DC

## 2022-03-09 NOTE — Telephone Encounter (Signed)
E-scribed rxs to [facility].

## 2022-03-11 ENCOUNTER — Ambulatory Visit (INDEPENDENT_AMBULATORY_CARE_PROVIDER_SITE_OTHER): Payer: 59 | Admitting: Podiatry

## 2022-03-11 ENCOUNTER — Encounter: Payer: Self-pay | Admitting: Podiatry

## 2022-03-11 DIAGNOSIS — M79674 Pain in right toe(s): Secondary | ICD-10-CM

## 2022-03-11 DIAGNOSIS — M79675 Pain in left toe(s): Secondary | ICD-10-CM | POA: Diagnosis not present

## 2022-03-11 DIAGNOSIS — B351 Tinea unguium: Secondary | ICD-10-CM

## 2022-03-11 DIAGNOSIS — E1169 Type 2 diabetes mellitus with other specified complication: Secondary | ICD-10-CM

## 2022-03-11 NOTE — Progress Notes (Signed)
This patient returns to my office for at risk foot care.  This patient requires this care by a professional since this patient will be at risk due to having type 2 diabetes.    This patient is unable to cut nails himself since the patient cannot reach his nails.These nails are painful walking and wearing shoes.  This patient presents for at risk foot care today.  General Appearance  Alert, conversant and in no acute stress.  Vascular  Dorsalis pedis and posterior tibial  pulses are palpable  bilaterally.  Capillary return is within normal limits  bilaterally. Temperature is within normal limits  bilaterally.  Neurologic  Senn-Weinstein monofilament wire test within normal limits  bilaterally. Muscle power within normal limits bilaterally.  Nails Thick disfigured discolored nails with subungual debris  hallux nails bilaterally. No evidence of bacterial infection or drainage bilaterally.  Orthopedic  No limitations of motion  feet .  No crepitus or effusions noted.  No bony pathology or digital deformities noted.  Midfoot  DJD  B/L.  Skin  normotropic skin with no porokeratosis noted bilaterally.  No signs of infections or ulcers noted.     Onychomycosis  Pain in right toes  Pain in left toes  Consent was obtained for treatment procedures.   Mechanical debridement of nails 1-5  bilaterally performed with a nail nipper.  Filed with dremel without incident.    Return office visit    3 months                  Told patient to return for periodic foot care and evaluation due to potential at risk complications.   Helane Gunther DPM

## 2022-03-16 ENCOUNTER — Encounter: Payer: 59 | Admitting: Family Medicine

## 2022-03-23 ENCOUNTER — Other Ambulatory Visit: Payer: Self-pay | Admitting: Family Medicine

## 2022-03-23 DIAGNOSIS — I10 Essential (primary) hypertension: Secondary | ICD-10-CM

## 2022-05-27 ENCOUNTER — Other Ambulatory Visit: Payer: Self-pay | Admitting: Family Medicine

## 2022-05-27 DIAGNOSIS — I10 Essential (primary) hypertension: Secondary | ICD-10-CM

## 2022-05-27 NOTE — Telephone Encounter (Signed)
Too soon.  Rx sent to 03/09/22, #90/1 to [facility].

## 2022-07-26 ENCOUNTER — Other Ambulatory Visit (INDEPENDENT_AMBULATORY_CARE_PROVIDER_SITE_OTHER): Payer: 59

## 2022-07-26 DIAGNOSIS — Z125 Encounter for screening for malignant neoplasm of prostate: Secondary | ICD-10-CM | POA: Diagnosis not present

## 2022-07-26 DIAGNOSIS — E785 Hyperlipidemia, unspecified: Secondary | ICD-10-CM

## 2022-07-26 DIAGNOSIS — I10 Essential (primary) hypertension: Secondary | ICD-10-CM

## 2022-07-26 DIAGNOSIS — E1169 Type 2 diabetes mellitus with other specified complication: Secondary | ICD-10-CM | POA: Diagnosis not present

## 2022-07-26 LAB — COMPREHENSIVE METABOLIC PANEL
ALT: 14 U/L (ref 0–53)
AST: 12 U/L (ref 0–37)
Albumin: 4.1 g/dL (ref 3.5–5.2)
Alkaline Phosphatase: 111 U/L (ref 39–117)
BUN: 14 mg/dL (ref 6–23)
CO2: 30 mEq/L (ref 19–32)
Calcium: 9.6 mg/dL (ref 8.4–10.5)
Chloride: 97 mEq/L (ref 96–112)
Creatinine, Ser: 0.89 mg/dL (ref 0.40–1.50)
GFR: 98.68 mL/min (ref >60.00)
Glucose, Bld: 289 mg/dL — ABNORMAL HIGH (ref 70–99)
Potassium: 4.1 mEq/L (ref 3.5–5.1)
Sodium: 137 mEq/L (ref 135–145)
Total Bilirubin: 0.4 mg/dL (ref 0.2–1.2)
Total Protein: 7.1 g/dL (ref 6.0–8.3)

## 2022-07-26 LAB — LIPID PANEL
Cholesterol: 173 mg/dL (ref 0–200)
HDL: 29 mg/dL — ABNORMAL LOW (ref >39.00)
NonHDL: 144.26
Total CHOL/HDL Ratio: 6
Triglycerides: 300 mg/dL — ABNORMAL HIGH (ref 0.0–149.0)
VLDL: 60 mg/dL — ABNORMAL HIGH (ref 0.0–40.0)

## 2022-07-26 LAB — MICROALBUMIN / CREATININE URINE RATIO
Creatinine,U: 199.3 mg/dL
Microalb Creat Ratio: 0.9 mg/g (ref 0.0–30.0)
Microalb, Ur: 1.7 mg/dL (ref 0.0–1.9)

## 2022-07-26 LAB — CBC WITH DIFFERENTIAL/PLATELET
Basophils Absolute: 0 10*3/uL (ref 0.0–0.1)
Basophils Relative: 0.3 % (ref 0.0–3.0)
Eosinophils Absolute: 0.1 10*3/uL (ref 0.0–0.7)
Eosinophils Relative: 1.1 % (ref 0.0–5.0)
HCT: 41 % (ref 39.0–52.0)
Hemoglobin: 13.4 g/dL (ref 13.0–17.0)
Lymphocytes Relative: 26.1 % (ref 12.0–46.0)
Lymphs Abs: 2.3 10*3/uL (ref 0.7–4.0)
MCHC: 32.6 g/dL (ref 30.0–36.0)
MCV: 88 fl (ref 78.0–100.0)
Monocytes Absolute: 0.5 10*3/uL (ref 0.1–1.0)
Monocytes Relative: 5.9 % (ref 3.0–12.0)
Neutro Abs: 5.9 10*3/uL (ref 1.4–7.7)
Neutrophils Relative %: 66.6 % (ref 43.0–77.0)
Platelets: 239 10*3/uL (ref 150.0–400.0)
RBC: 4.67 Mil/uL (ref 4.22–5.81)
RDW: 12.9 % (ref 11.5–15.5)
WBC: 8.9 10*3/uL (ref 4.0–10.5)

## 2022-07-26 LAB — LDL CHOLESTEROL, DIRECT: Direct LDL: 79 mg/dL

## 2022-07-26 LAB — PSA: PSA: 0.85 ng/mL (ref 0.10–4.00)

## 2022-07-27 ENCOUNTER — Other Ambulatory Visit (INDEPENDENT_AMBULATORY_CARE_PROVIDER_SITE_OTHER): Payer: 59

## 2022-07-27 DIAGNOSIS — E1169 Type 2 diabetes mellitus with other specified complication: Secondary | ICD-10-CM

## 2022-07-27 LAB — HEMOGLOBIN A1C: Hgb A1c MFr Bld: 7.8 % — ABNORMAL HIGH (ref 4.6–6.5)

## 2022-07-29 ENCOUNTER — Encounter: Payer: Self-pay | Admitting: Family Medicine

## 2022-07-29 ENCOUNTER — Telehealth: Payer: Self-pay | Admitting: Family Medicine

## 2022-07-29 ENCOUNTER — Ambulatory Visit (INDEPENDENT_AMBULATORY_CARE_PROVIDER_SITE_OTHER): Payer: 59 | Admitting: Family Medicine

## 2022-07-29 VITALS — BP 138/86 | HR 94 | Temp 98.2°F | Ht 74.25 in | Wt 335.5 lb

## 2022-07-29 DIAGNOSIS — G4733 Obstructive sleep apnea (adult) (pediatric): Secondary | ICD-10-CM

## 2022-07-29 DIAGNOSIS — E1169 Type 2 diabetes mellitus with other specified complication: Secondary | ICD-10-CM | POA: Diagnosis not present

## 2022-07-29 DIAGNOSIS — Z8679 Personal history of other diseases of the circulatory system: Secondary | ICD-10-CM | POA: Diagnosis not present

## 2022-07-29 NOTE — Telephone Encounter (Signed)
Patient came by and dropped off paperwork for Dr. Darnell Level. Placed in his box.

## 2022-07-29 NOTE — Progress Notes (Unsigned)
Patient ID: Jonathan Compton, male    DOB: [birth year], 52 y.o.   MRN: [id number]  This visit was conducted in person.  BP 138/86   Pulse 94   Temp 98.2 F (36.8 C) (Temporal)   Ht 6' 2.25" (1.886 m)   Wt (!) 335 lb 8 oz (152.2 kg)   SpO2 96%   BMI 42.79 kg/m    CC: DM f/u- too soon for physical Subjective:   HPI: Jonathan Compton is a 52 y.o. male presenting on 07/29/2022 for Annual Exam   Last CPE 10/2021.  Out of work since 07/08/2022 - states he stayed out of work because fasting sugars have been over 200 and was told to not drive when sugar levels were high. 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 He notes he's been more lax with diet over holiday season. He brings FMLA paperwork to fill out - states endocrinology said PCP should fill this out. Next endocrinology appt is in 2 weeks.   OSA s/p sleep study 06/2021 - moderate OSA with oxygen desaturations. Was recommended CPAP commencement, but he decided not to try it as has not previously tolerated machine. Requests ENT eval for Inspire device.   DM - followed by endo Dr Talmage Nap, upcoming appt later today. Does regularly check sugars. Fasting today 264. Compliant with antihyperglycemic regimen which includes: amaryl 2mg  daily and metformin 500mg  bid, occasionally misses evening dose. Denies low sugars or hypoglycemic symptoms. Denies blurry vision. Occ foot paresthesias. Last diabetic eye exam DUE ([facility] Glucometer brand: Freestyle Libre 2 and 3. Last foot exam: upcoming appt with podiatrist q6 mo. DSME: 09/2015.  Lab Results  Component Value Date   HGBA1C 7.8 (H) 07/27/2022   Diabetic Foot Exam - Simple   No data filed    Lab Results  Component Value Date   MICROALBUR 1.7 07/26/2022       Relevant past medical, surgical, family and social history reviewed and updated as indicated. Interim medical history since our last visit  reviewed. Allergies and medications reviewed and updated. Outpatient Medications Prior to Visit  Medication Sig Dispense Refill   amLODipine (NORVASC) 10 MG tablet TAKE 1 TABLET EVERY DAY *ALONG WITH OLMES/HCTZ *MAKE APPT 90 tablet 1   Continuous Blood Gluc Sensor (FREESTYLE LIBRE 3 SENSOR) MISC USE AS DIRECTED CHANGE EVERY 2 WEEKS 28 6 each 1   famotidine (PEPCID) 20 MG tablet Take 1 tablet (20 mg total) by mouth 2 (two) times daily. 180 tablet 3   metFORMIN (GLUCOPHAGE-XR) 500 MG 24 hr tablet Take 2 tablets (1,000 mg total) by mouth 2 (two) times daily with a meal.     olmesartan-hydrochlorothiazide (BENICAR HCT) 40-25 MG tablet TAKE 1 TABLET EVERY DAY *ALONG WITH AMLODIPINE 90 tablet 1   glimepiride (AMARYL) 2 MG tablet Take 1 tablet (2 mg total) by mouth daily. 90 tablet 1   metFORMIN (GLUCOPHAGE) 500 MG tablet Take 1 tablet (500 mg total) by mouth 2 (two) times daily with a meal. For diabetes 180 tablet 1   Blood Pressure Monitoring (BLOOD PRESSURE CUFF) MISC Use as directed to check blood pressures daily or as needed. XL cuff (Patient not taking: Reported on 07/29/2022) 1 each 0   glimepiride (AMARYL) 2 MG tablet Take 1 tablet (2 mg total) by mouth in the morning and at bedtime.     Facility-Administered Medications Prior to Visit  Medication Dose Route Frequency Provider Last Rate Last Admin   fentaNYL (SUBLIMAZE) injection    PRN Sherren Mocha, MD   25 mcg at 10/05/16 1018   heparin infusion 2 units/mL in 0.9 % sodium chloride    Continuous PRN Sherren Mocha, MD   1,000 mL at 10/05/16 1043   heparin injection    PRN Sherren Mocha, MD   5,000 Units at 10/05/16 1023   iopamidol (ISOVUE-370) 76 % injection    PRN Sherren Mocha, MD   90 mL at 10/05/16 1043   lidocaine (PF) (XYLOCAINE) 1 % injection    PRN Sherren Mocha, MD   2 mL at 10/05/16 1020   midazolam (VERSED) injection    PRN Sherren Mocha, MD   1 mg at 10/05/16 1018   Radial Cocktail/Verapamil only    PRN Sherren Mocha,  MD   10 mL at 10/05/16 1022     Per HPI unless specifically indicated in ROS section below Review of Systems  Objective:  BP 138/86   Pulse 94   Temp 98.2 F (36.8 C) (Temporal)   Ht 6' 2.25" (1.886 m)   Wt (!) 335 lb 8 oz (152.2 kg)   SpO2 96%   BMI 42.79 kg/m   Wt Readings from Last 3 Encounters:  07/29/22 (!) 335 lb 8 oz (152.2 kg)  11/08/21 (!) 334 lb (151.5 kg)  06/29/21 (!) 325 lb (147.4 kg)      Physical Exam Vitals and nursing note reviewed.  Constitutional:      Appearance: Normal appearance. He is not ill-appearing.  HENT:     Head: Normocephalic and atraumatic.     Mouth/Throat:     Mouth: Mucous membranes are moist.     Pharynx: Oropharynx is clear. No oropharyngeal exudate or posterior oropharyngeal erythema.  Eyes:     Extraocular Movements: Extraocular movements intact.     Conjunctiva/sclera: Conjunctivae normal.     Pupils: Pupils are equal, round, and reactive to light.  Cardiovascular:     Rate and Rhythm: Normal rate and regular rhythm.     Pulses: Normal pulses.     Heart sounds: Normal heart sounds. No murmur heard. Pulmonary:     Effort: Pulmonary effort is normal. No respiratory distress.     Breath sounds: Normal breath sounds. No wheezing, rhonchi or rales.  Musculoskeletal:     Right lower leg: No edema.     Left lower leg: No edema.     Comments: See HPI for foot exam if done  Skin:    General: Skin is warm and dry.     Findings: No rash.  Neurological:     Mental Status: He is alert.  Psychiatric:        Mood and Affect: Mood normal.        Behavior: Behavior normal.       Results for orders placed or performed in visit on 07/27/22  Hemoglobin A1c  Result Value Ref Range   Hgb A1c MFr Bld 7.8 (H) 4.6 - 6.5 %   Lab Results  Component Value Date   CHOL 173 07/26/2022   HDL 29.00 (L) 07/26/2022   LDLCALC 79 11/08/2021   LDLDIRECT 79.0 07/26/2022   TRIG 300.0 (H) 07/26/2022   CHOLHDL 6 07/26/2022   Lab Results  Component  Value Date   CREATININE 0.89 07/26/2022   BUN 14 07/26/2022   NA 137 07/26/2022   K 4.1 07/26/2022   CL 97 07/26/2022   CO2 30 07/26/2022  Lab Results  Component Value Date   WBC 8.9 07/26/2022   HGB 13.4 07/26/2022   HCT 41.0 07/26/2022   MCV 88.0 07/26/2022   PLT 239.0 07/26/2022    Assessment & Plan:   Problem List Items Addressed This Visit     Obesity, Class III, BMI 40-49.9 (morbid obesity) ([facility])    Continue to encourage healthy diet and lifestyle choices to affect sustainable weight loss. Obesity complicated by DM, OSA, HTN, GERD, HLD      Relevant Medications   glimepiride (AMARYL) 2 MG tablet   metFORMIN (GLUCOPHAGE-XR) 500 MG 24 hr tablet   Type 2 diabetes mellitus with other specified complication ([facility]) - Primary    Chronic, deteriorated control based on recent cbg's in setting of dietary liberties during holiday season. No other cause for hyperglycemia noted.  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 He saw endocrinology today who increased metformin XR 500mg  to 2 tab BID and amaryl 2mg  to BID, with close follow up planned in 2 weeks. He requests FMLA forms filled out from 12/15 with expected return to work date of 08/01/2021. Advised for future FMLA needs I expect he contact us right away when he's taken out of work to come up with treatment and return to work plan.  Recent A1c 7.8%.  Advised he call and schedule diabetic eye exam as due.       Relevant Medications   glimepiride (AMARYL) 2 MG tablet   metFORMIN (GLUCOPHAGE-XR) 500 MG 24 hr tablet   OSA (obstructive sleep apnea)    Last saw Dr Ander Slade 05/2021. States unable to tolerate CPAP mask. Will refer to ENT for evaluation for Inspire device eligibility.       Relevant Orders   Ambulatory referral to ENT   History of pericarditis    Encouraged he call cardiology for follow up as due.         No orders of the defined types were placed  in this encounter.  Orders Placed This Encounter  Procedures   Ambulatory referral to ENT    Referral Priority:   Routine    Referral Type:   Consultation    Referral Reason:   Specialty Services Required    Requested Specialty:   Otolaryngology    Number of Visits Requested:   1    Patient Instructions  Continue current medicines. Sugar was worse - work with Dr Chalmers Cater on sugar control. Consider cholesterol medicine  We will refer you to ENT to discuss other sleep apnea treatments. Call heart doctor for appointment.  Return in 4 months for physical, prior for fasting labs.   Follow up plan: Return in about 4 months (around 11/27/2022) for annual exam, prior fasting for blood work.  Ria Bush, MD

## 2022-07-29 NOTE — Patient Instructions (Addendum)
Continue current medicines. Sugar was worse - work with Dr Chalmers Cater on sugar control. Consider cholesterol medicine  We will refer you to ENT to discuss other sleep apnea treatments. Call heart doctor for appointment.  Return in 4 months for physical, prior for fasting labs.

## 2022-07-29 NOTE — Telephone Encounter (Addendum)
Placed ppw in Dr. G's box.  ?

## 2022-07-30 NOTE — Assessment & Plan Note (Signed)
Encouraged he call cardiology for follow up as due.

## 2022-07-30 NOTE — Assessment & Plan Note (Addendum)
Chronic, deteriorated control based on recent cbg's in setting of dietary liberties during holiday season. No other cause for hyperglycemia noted.  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 He saw endocrinology today who increased metformin XR 500mg  to 2 tab BID and amaryl 2mg  to BID, with close follow up planned in 2 weeks. He requests FMLA forms filled out from 12/15 with expected return to work date of 08/01/2021. Advised for future FMLA needs I expect he contact us right away when he's taken out of work to come up with treatment and return to work plan.  Recent A1c 7.8%.  Advised he call and schedule diabetic eye exam as due.

## 2022-07-30 NOTE — Assessment & Plan Note (Signed)
Last saw Dr Ander Slade 05/2021. States unable to tolerate CPAP mask. Will refer to ENT for evaluation for Inspire device eligibility.

## 2022-07-30 NOTE — Assessment & Plan Note (Signed)
Continue to encourage healthy diet and lifestyle choices to affect sustainable weight loss. Obesity complicated by DM, OSA, HTN, GERD, HLD

## 2022-08-01 NOTE — Telephone Encounter (Signed)
Jonathan Compton' FMLA form has been faxed to fax# [phone].  Received fax confirmation.  Copies made for scanning, patient, and myself.  Sent note via mychart to see how patient would like to receive his copy.

## 2022-08-01 NOTE — Telephone Encounter (Signed)
Filled and placed in Lisa's box this morning.

## 2022-08-02 ENCOUNTER — Other Ambulatory Visit: Payer: Self-pay | Admitting: Family Medicine

## 2022-08-02 ENCOUNTER — Encounter: Payer: Self-pay | Admitting: *Deleted

## 2022-08-02 DIAGNOSIS — I10 Essential (primary) hypertension: Secondary | ICD-10-CM

## 2022-08-02 NOTE — Telephone Encounter (Signed)
Placed patient's copy in outgoing mail.  Notified via mychart.

## 2022-08-10 ENCOUNTER — Telehealth: Payer: Self-pay

## 2022-08-10 NOTE — Telephone Encounter (Signed)
Received faxed additional FMLA form from [employer]. Need to fax completed form to [phone].  Placed form in Dr. Synthia Innocent box.

## 2022-08-12 ENCOUNTER — Encounter: Payer: Self-pay | Admitting: Family Medicine

## 2022-08-12 NOTE — Telephone Encounter (Signed)
Form has been faxed to [phone].  Received fax confirmation.  Copies made for scanning, patient, and myself.  Patient's copy placed in outgoing mail per request.

## 2022-08-12 NOTE — Telephone Encounter (Signed)
Completed form has been faxed to fax# [phone].  Received fax confirmation.  Copies have been made for scanning, patient, and myself.  Sent msg via mychart to see how patient wants his copy.

## 2022-08-12 NOTE — Telephone Encounter (Signed)
Forms updated and in Lisa's box.

## 2022-08-12 NOTE — Telephone Encounter (Signed)
Patient's copy placed in outgoing mail per request. 

## 2022-08-12 NOTE — Telephone Encounter (Signed)
Filled and returned to [place].

## 2022-08-12 NOTE — Telephone Encounter (Signed)
We received an additional form to fill out for the same leave for Jonathan Compton.  Per the new letter, there is a date discrepancy, so they want you to fill the form out again.  It is due today.

## 2022-08-12 NOTE — Telephone Encounter (Signed)
Filled and in Lisa's box 

## 2022-09-12 ENCOUNTER — Encounter: Payer: Self-pay | Admitting: Podiatry

## 2022-09-12 ENCOUNTER — Ambulatory Visit (INDEPENDENT_AMBULATORY_CARE_PROVIDER_SITE_OTHER): Payer: 59 | Admitting: Podiatry

## 2022-09-12 DIAGNOSIS — M79674 Pain in right toe(s): Secondary | ICD-10-CM | POA: Diagnosis not present

## 2022-09-12 DIAGNOSIS — E1169 Type 2 diabetes mellitus with other specified complication: Secondary | ICD-10-CM | POA: Diagnosis not present

## 2022-09-12 DIAGNOSIS — B351 Tinea unguium: Secondary | ICD-10-CM | POA: Diagnosis not present

## 2022-09-12 DIAGNOSIS — M79675 Pain in left toe(s): Secondary | ICD-10-CM | POA: Diagnosis not present

## 2022-09-12 NOTE — Progress Notes (Signed)
This patient returns to my office for at risk foot care.  This patient requires this care by a professional since this patient will be at risk due to having type 2 diabetes.    This patient is unable to cut nails himself since the patient cannot reach his nails.These nails are painful walking and wearing shoes.  This patient presents for at risk foot care today.  General Appearance  Alert, conversant and in no acute stress.  Vascular  Dorsalis pedis and posterior tibial  pulses are palpable  bilaterally.  Capillary return is within normal limits  bilaterally. Temperature is within normal limits  bilaterally.  Neurologic  Senn-Weinstein monofilament wire test within normal limits  bilaterally. Muscle power within normal limits bilaterally.  Nails Thick disfigured discolored nails with subungual debris  hallux nails bilaterally. No evidence of bacterial infection or drainage bilaterally.  Orthopedic  No limitations of motion  feet .  No crepitus or effusions noted.  No bony pathology or digital deformities noted.  Midfoot  DJD  B/L.  Skin  normotropic skin with no porokeratosis noted bilaterally.  No signs of infections or ulcers noted.     Onychomycosis  Pain in right toes  Pain in left toes  Consent was obtained for treatment procedures.   Mechanical debridement of nails 1-5  bilaterally performed with a nail nipper.  Filed with dremel without incident.    Return office visit    6  months                  Told patient to return for periodic foot care and evaluation due to potential at risk complications.   Gardiner Barefoot DPM

## 2022-09-26 ENCOUNTER — Other Ambulatory Visit: Payer: Self-pay | Admitting: Family Medicine

## 2022-09-26 DIAGNOSIS — I10 Essential (primary) hypertension: Secondary | ICD-10-CM

## 2022-10-24 ENCOUNTER — Encounter: Payer: Self-pay | Admitting: Family Medicine

## 2022-11-03 NOTE — Progress Notes (Signed)
Cardiology Office Note:    Date:  11/04/2022  ID:  [name], DOB 1970/08/04, MRN [id number] PCP: Eustaquio Boyden, MD  [facility] Providers Cardiologist:  Tonny Bollman, MD      Patient Profile:   History of pericarditis Presented in 2018 with chest pain and ST elevation suggestive of anterolateral injury Cath 10/05/16: Normal coronary arteries>>dx with pericarditis TTE 10/06/2016 EF 55-60, no RWMA Myoview 04/07/2020: Apical thinning, no ischemia, EF 48, low risk Hypertension Sleep apnea Diabetes mellitus Obesity     History of Present Illness:   Jonathan Compton is a 52 y.o. male who returns for cardiology follow-up.  He was last seen 05/31/2021.  He is here alone.  Overall, he is doing well.  He still has an occasional chest pain from time to time.  This has been fairly chronic without significant change.  He has not had orthopnea, leg edema, syncope, shortness of breath.  He is unable to tolerate CPAP.  He has an appointment with ENT soon.  Review of Systems  Cardiovascular:  Negative for claudication.   see HPI    Studies Reviewed:    EKG: NSR, HR 80, left axis deviation, first-degree AV block, PR 222, QTc 435, inferior and anteroseptal Q waves, similar to prior tracings  Risk Assessment/Calculations:     HYPERTENSION CONTROL Vitals:   11/04/22 0812 11/04/22 1256  BP: 138/89 (!) 130/94    The patient's blood pressure is elevated above target today.  In order to address the patient's elevated BP: A new medication was prescribed today.          Physical Exam:   VS:  BP (!) 130/94   Pulse 80   Ht  (1.905 m)   Wt (!) 325 lb 12.8 oz (147.8 kg)   SpO2 94%   BMI 40.72 kg/m    Wt Readings from Last 3 Encounters:  11/04/22 (!) 325 lb 12.8 oz (147.8 kg)  07/29/22 (!) 335 lb 8 oz (152.2 kg)  11/08/21 (!) 334 lb (151.5 kg)    Constitutional:      Appearance: Healthy appearance. Not in distress.  Neck:     Vascular: No carotid bruit. JVD normal.   Pulmonary:     Breath sounds: Normal breath sounds. No wheezing. No rales.  Cardiovascular:     Normal rate. Regular rhythm. Normal S1. Normal S2.      Murmurs: There is no murmur.  Edema:    Peripheral edema absent.  Abdominal:     Palpations: Abdomen is soft.       ASSESSMENT AND PLAN:   Essential hypertension Blood pressure is above target.  He does have a first-degree AV block on EKG.  It may be best to avoid beta-blocker.  I have recommended starting doxazosin 2 mg in the evening.  I warned him of the potential for orthostasis with the first couple of doses.  Continue amlodipine 10 mg daily, Benicar HCTZ 40/25 mg daily.  Send blood pressure readings in 2 weeks.  History of pericarditis He has occasional chest pain.  This is fairly chronic.  There has not been significant change.  He had a stress test in 2021 that demonstrated no ischemia.  No further testing needed at this time.  Obesity, Class III, BMI 40-49.9 (morbid obesity) (HCC) We discussed options for weight loss including GLP-1 agonist.  I offered to refer him to our Pharm.D. clinic for consideration.  He prefers to hold off on this for now.  He may  discuss it with his endocrinologist.  Dyslipidemia associated with type 2 diabetes mellitus (HCC) Managed by primary care.  He is trying to avoid medication.         Dispo:  Return in about 1 year (around 11/04/2023) for Routine Follow Up, w/ Dr. Excell Seltzer, or Tereso Newcomer, PA-C.  Signed, Tereso Newcomer, PA-C

## 2022-11-04 ENCOUNTER — Ambulatory Visit: Payer: 59 | Attending: Physician Assistant | Admitting: Physician Assistant

## 2022-11-04 ENCOUNTER — Encounter: Payer: Self-pay | Admitting: Physician Assistant

## 2022-11-04 VITALS — BP 130/94 | HR 80 | Ht 75.0 in | Wt 325.8 lb

## 2022-11-04 DIAGNOSIS — I10 Essential (primary) hypertension: Secondary | ICD-10-CM | POA: Diagnosis not present

## 2022-11-04 DIAGNOSIS — E785 Hyperlipidemia, unspecified: Secondary | ICD-10-CM

## 2022-11-04 DIAGNOSIS — E1169 Type 2 diabetes mellitus with other specified complication: Secondary | ICD-10-CM | POA: Diagnosis not present

## 2022-11-04 DIAGNOSIS — Z8679 Personal history of other diseases of the circulatory system: Secondary | ICD-10-CM

## 2022-11-04 MED ORDER — DOXAZOSIN MESYLATE 2 MG PO TABS: 2.0000 mg | ORAL_TABLET | Freq: Every day | ORAL | 3 refills | Status: DC

## 2022-11-04 NOTE — Assessment & Plan Note (Signed)
Blood pressure is above target.  He does have a first-degree AV block on EKG.  It may be best to avoid beta-blocker.  I have recommended starting doxazosin 2 mg in the evening.  I warned him of the potential for orthostasis with the first couple of doses.  Continue amlodipine 10 mg daily, Benicar HCTZ 40/25 mg daily.  Send blood pressure readings in 2 weeks.

## 2022-11-04 NOTE — Assessment & Plan Note (Signed)
He has occasional chest pain.  This is fairly chronic.  There has not been significant change.  He had a stress test in 2021 that demonstrated no ischemia.  No further testing needed at this time.

## 2022-11-04 NOTE — Patient Instructions (Signed)
Medication Instructions:  Your physician has recommended you make the following change in your medication:   START Doxasosin 2 mg taking one every evening  *If you need a refill on your cardiac medications before your next appointment, please call your pharmacy*   Lab Work: None ordered  If you have labs (blood work) drawn today and your tests are completely normal, you will receive your results only by: MyChart Message (if you have MyChart) OR A paper copy in the mail If you have any lab test that is abnormal or we need to change your treatment, we will call you to review the results.   Testing/Procedures: None ordered   Follow-Up: At [facility], you and your health needs are our priority.  As part of our continuing mission to provide you with exceptional heart care, we have created designated Provider Care Teams.  These Care Teams include your primary Cardiologist (physician) and Advanced Practice Providers (APPs -  Physician Assistants and Nurse Practitioners) who all work together to provide you with the care you need, when you need it.  We recommend signing up for the patient portal called "MyChart".  Sign up information is provided on this After Visit Summary.  MyChart is used to connect with patients for Virtual Visits (Telemedicine).  Patients are able to view lab/test results, encounter notes, upcoming appointments, etc.  Non-urgent messages can be sent to your provider as well.   To learn more about what you can do with MyChart, go to [email or web address].    Your next appointment:   1 year(s)  Provider:   Tonny Bollman, MD  or Tereso Newcomer, PA-C         Other Instructions Your physician has requested that you regularly monitor and record your blood pressure readings at home. Please use the same machine at the same time of day to check your readings and record them to bring to your follow-up visit.   Please monitor blood pressures and keep a log of your  readings for 2 weeks then send a mychart message with those.    Make sure to check 2 hours after your medications.    AVOID these things for 30 minutes before checking your blood pressure: No Drinking caffeine. No Drinking alcohol. No Eating. No Smoking. No Exercising.   Five minutes before checking your blood pressure: Pee. Sit in a dining chair. Avoid sitting in a soft couch or armchair. Be quiet. Do not talk

## 2022-11-04 NOTE — Assessment & Plan Note (Signed)
Managed by primary care.  He is trying to avoid medication.

## 2022-11-04 NOTE — Assessment & Plan Note (Signed)
We discussed options for weight loss including GLP-1 agonist.  I offered to refer him to our Pharm.D. clinic for consideration.  He prefers to hold off on this for now.  He may discuss it with his endocrinologist.

## 2022-11-08 ENCOUNTER — Encounter: Payer: Self-pay | Admitting: Family Medicine

## 2022-11-08 ENCOUNTER — Telehealth: Payer: Self-pay | Admitting: Family Medicine

## 2022-11-08 NOTE — Telephone Encounter (Signed)
Received faxed FMLA ppw from [employer]  Placed form in Dr. Timoteo Expose box.

## 2022-11-08 NOTE — Telephone Encounter (Signed)
Pt called asking for office fax # to fax over FMLA ppw for Dr. Reece Agar to comp. Pt stated he was going to send Dr. Reece Agar a message via MyChart discussing the ppw. Pt stated the last FMLA ppw Dr. Reece Agar comp for him in the past stated someone inaccurate hours for office visits. Pt requested a call back from Kathleen to discuss what he believes the ppw to state this time regarding those hours. Call back # [phone]

## 2022-11-08 NOTE — Telephone Encounter (Signed)
Rtn pt's call.  Pt states Dr. Reece Agar completed the FMLA ppw in reference to OVs in a 60 day period. However, pt states he needs ppw/notes to say he needs to be out form work at least a couple of days per "episode" to keep from being penalized. For instance, pt states he was feeling bad yesterday and is home today. Says he was approved up to 400 hrs through 01/2024.

## 2022-11-14 NOTE — Telephone Encounter (Signed)
Placed FMLA form at front office. Return form to [place] once pt complete's, signs and dates form.

## 2022-11-14 NOTE — Telephone Encounter (Addendum)
Filled and in Lisa's box.  He needs to complete first page Please get more information - why was he out all last week? What symptoms led to this?  When is next endocrinology appt?  Recommend he schedule OV for evaluation when he's out more than 2 days in a row.

## 2022-11-14 NOTE — Telephone Encounter (Signed)
See MyChart message

## 2022-11-17 ENCOUNTER — Other Ambulatory Visit: Payer: Self-pay | Admitting: Family Medicine

## 2022-11-17 DIAGNOSIS — E1169 Type 2 diabetes mellitus with other specified complication: Secondary | ICD-10-CM

## 2022-11-21 ENCOUNTER — Other Ambulatory Visit (INDEPENDENT_AMBULATORY_CARE_PROVIDER_SITE_OTHER): Payer: 59

## 2022-11-21 DIAGNOSIS — E785 Hyperlipidemia, unspecified: Secondary | ICD-10-CM

## 2022-11-21 DIAGNOSIS — E1169 Type 2 diabetes mellitus with other specified complication: Secondary | ICD-10-CM | POA: Diagnosis not present

## 2022-11-21 LAB — HEMOGLOBIN A1C: Hgb A1c MFr Bld: 11.2 % — ABNORMAL HIGH (ref 4.6–6.5)

## 2022-11-21 LAB — COMPREHENSIVE METABOLIC PANEL
ALT: 15 U/L (ref 0–53)
AST: 15 U/L (ref 0–37)
Albumin: 3.9 g/dL (ref 3.5–5.2)
Alkaline Phosphatase: 102 U/L (ref 39–117)
BUN: 11 mg/dL (ref 6–23)
CO2: 26 mEq/L (ref 19–32)
Calcium: 8.7 mg/dL (ref 8.4–10.5)
Chloride: 102 mEq/L (ref 96–112)
Creatinine, Ser: 0.86 mg/dL (ref 0.40–1.50)
GFR: 99.48 mL/min (ref >60.00)
Glucose, Bld: 222 mg/dL — ABNORMAL HIGH (ref 70–99)
Potassium: 3.5 mEq/L (ref 3.5–5.1)
Sodium: 137 mEq/L (ref 135–145)
Total Bilirubin: 0.7 mg/dL (ref 0.2–1.2)
Total Protein: 6.8 g/dL (ref 6.0–8.3)

## 2022-11-21 LAB — LIPID PANEL
Cholesterol: 93 mg/dL (ref 0–200)
HDL: 19.4 mg/dL — ABNORMAL LOW (ref >39.00)
LDL Cholesterol: 35 mg/dL (ref 0–99)
NonHDL: 73.27
Total CHOL/HDL Ratio: 5
Triglycerides: 189 mg/dL — ABNORMAL HIGH (ref 0.0–149.0)
VLDL: 37.8 mg/dL (ref 0.0–40.0)

## 2022-11-21 LAB — MICROALBUMIN / CREATININE URINE RATIO
Creatinine,U: 98 mg/dL
Microalb Creat Ratio: 0.7 mg/g (ref 0.0–30.0)
Microalb, Ur: <0.7 mg/dL (ref 0.0–1.9)

## 2022-11-21 NOTE — Telephone Encounter (Signed)
Faxed FMLA ppw to [employer] at [phone]. Mailed original to pt.

## 2022-11-21 NOTE — Telephone Encounter (Signed)
Pt came in office and signed FMLA paperwork it was place in PCP folder

## 2022-11-28 ENCOUNTER — Encounter: Payer: Self-pay | Admitting: Family Medicine

## 2022-11-28 ENCOUNTER — Ambulatory Visit (INDEPENDENT_AMBULATORY_CARE_PROVIDER_SITE_OTHER): Payer: 59 | Admitting: Family Medicine

## 2022-11-28 VITALS — BP 134/82 | HR 78 | Temp 97.3°F | Ht 74.5 in | Wt 324.1 lb

## 2022-11-28 DIAGNOSIS — I10 Essential (primary) hypertension: Secondary | ICD-10-CM

## 2022-11-28 DIAGNOSIS — I440 Atrioventricular block, first degree: Secondary | ICD-10-CM | POA: Diagnosis not present

## 2022-11-28 DIAGNOSIS — Z0001 Encounter for general adult medical examination with abnormal findings: Secondary | ICD-10-CM | POA: Diagnosis not present

## 2022-11-28 DIAGNOSIS — Z7984 Long term (current) use of oral hypoglycemic drugs: Secondary | ICD-10-CM

## 2022-11-28 DIAGNOSIS — E785 Hyperlipidemia, unspecified: Secondary | ICD-10-CM

## 2022-11-28 DIAGNOSIS — G4733 Obstructive sleep apnea (adult) (pediatric): Secondary | ICD-10-CM

## 2022-11-28 DIAGNOSIS — L918 Other hypertrophic disorders of the skin: Secondary | ICD-10-CM | POA: Insufficient documentation

## 2022-11-28 DIAGNOSIS — E1169 Type 2 diabetes mellitus with other specified complication: Secondary | ICD-10-CM

## 2022-11-28 DIAGNOSIS — R21 Rash and other nonspecific skin eruption: Secondary | ICD-10-CM

## 2022-11-28 LAB — HM DIABETES EYE EXAM

## 2022-11-28 MED ORDER — OLMESARTAN MEDOXOMIL-HCTZ 40-25 MG PO TABS: 1.0000 | ORAL_TABLET | Freq: Every day | ORAL | 4 refills | Status: DC

## 2022-11-28 MED ORDER — AMLODIPINE BESYLATE 10 MG PO TABS: 10.0000 mg | ORAL_TABLET | Freq: Every day | ORAL | 4 refills | Status: DC

## 2022-11-28 NOTE — Assessment & Plan Note (Signed)
Saw [facility] pulm 05/2021 followed by sleep study showing moderate OSA.  Intolerant to CPAP, doesn't want to retry. Not Inspire candidate due to elevated BMI.  Will call dentist for evaluation of oral appliance.

## 2022-11-28 NOTE — Assessment & Plan Note (Signed)
Bilateral axilla. Reviewed these can be a sign of insulin resistance. He requests derm referral to discuss removal.

## 2022-11-28 NOTE — Assessment & Plan Note (Signed)
Bilateral axilla in setting of worsened sugar control - anticipate axillary intertrigo - start lotrimin BID x 3-4 wks, update with effect

## 2022-11-28 NOTE — Assessment & Plan Note (Signed)
Encouraged healthy diet and lifestyle choices to affect sustainable weight loss. Encouraged he discuss GLP1RA option with endo.

## 2022-11-28 NOTE — Assessment & Plan Note (Addendum)
By EKG - avoid AV nodal blocking agents

## 2022-11-28 NOTE — Progress Notes (Signed)
Ph: [phone]       Fax: [phone]   Patient ID: Jonathan Compton, male    DOB: 23-Oct-1970, 52 y.o.   MRN: [id number]  This visit was conducted in person.  BP 134/82   Pulse 78   Temp (!) 97.3 F (36.3 C) (Temporal)   Ht 6' 2.5" (1.892 m)   Wt (!) 324 lb 2 oz (147 kg)   SpO2 96%   BMI 41.06 kg/m   BP Readings from Last 3 Encounters:  11/28/22 134/82  11/04/22 (!) 130/94  07/29/22 138/86   CC: CPE Subjective:   HPI: Jonathan Compton is a 52 y.o. male presenting on 11/28/2022 for Annual Exam   DM - sees endocrinologist Dr Talmage Nap, last seen 08/12/2022, I don't have any records available - have requested today. He doesn't have any endo f/u appt planned. Med list has metformin XR 1000mg  BID, amaryl 2mg  BID - confusion with meds, he states he's only taking metformin IR 500mg  BID along with amaryl 2mg  BID. Notes he's increased cherry cokes on the past few months - 1-2 every day. Saw eye doctor [facility] this morning - no diabetes damage. Attributes deteriorated control to increased regular sodas.  Lab Results  Component Value Date   HGBA1C 11.2 (H) 11/21/2022    OSA - intolerant to CPAP. Saw Dr Jenne Pane for [facility] device evaluation, given degree of OSA and elevated BMI, rec first explore oral appliance, info provided. He's reached out to dentist Irene Limbo - planned upcoming appt.   HTN, h/o pericarditis, 1st degree AVB, followed by cardiology. Last seen 10/2022, trial doxazosin however he stopped due to symptoms of dizziness, lightheadedness.   Bothersome skin tags - requests derm eval.  Preventative: COLONOSCOPY 09/2015 WNL ([facility])  ESOPHAGOGASTRODUODENOSCOPY 09/2015 WNL  Prostate cancer screening - father with prostate cancer dx 67 currently undergoing radiation. Continue yearly PSA (last checked 07/2022). No nocturia, weakening stream Lung cancer screening - not eligible. Flu - declines  COVID vaccine - J&J 11/2019, booster x1  Pneumovax 10/2017, prevnar-20 10/2021 Tdap  2014, 42023 Zostavax 06/2020 Shingrix - 08/2019, 06/2020 Seat belt use discussed  Sunscreen use discussed. 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 Interim medical history since our last visit reviewed. Allergies and medications reviewed and updated. Outpatient Medications Prior to Visit  Medication Sig Dispense Refill   Blood Pressure Monitoring (BLOOD PRESSURE CUFF) MISC Use as directed to check blood pressures daily or as needed. XL cuff 1 each 0   Continuous Blood Gluc Sensor (FREESTYLE LIBRE 3 SENSOR) MISC USE AS DIRECTED CHANGE EVERY 2 WEEKS 28 6 each 1   famotidine (PEPCID) 20 MG tablet Take 1 tablet (20 mg total) by mouth 2 (two) times daily. 180 tablet 3   glimepiride (AMARYL) 2 MG tablet Take 1 tablet (2 mg total) by mouth in the morning and at bedtime.     metFORMIN (GLUCOPHAGE-XR) 500 MG 24 hr tablet Take 2 tablets (1,000 mg total) by mouth 2 (two) times daily with a meal.     amLODipine (NORVASC)  10 MG tablet TAKE 1 TABLET BY MOUTH DAILY  ALONG WITH OLMES/HCTZ 90 tablet 1   doxazosin (CARDURA) 2 MG tablet Take 1 tablet (2 mg total) by mouth daily. 90 tablet 3   olmesartan-hydrochlorothiazide (BENICAR HCT) 40-25 MG tablet TAKE 1 TABLET BY MOUTH DAILY  ALONG WITH AMLODIPINE 90 tablet 0   fentaNYL (SUBLIMAZE) injection      heparin infusion 2 units/mL in 0.9 % sodium chloride      heparin injection      iopamidol (ISOVUE-370) 76 % injection      lidocaine (PF) (XYLOCAINE) 1 % injection      midazolam (VERSED) injection      Radial Cocktail/Verapamil only      No  facility-administered medications prior to visit.     Per HPI unless specifically indicated in ROS section below Review of Systems  Constitutional:  Negative for activity change, appetite change, chills, fatigue, fever and unexpected weight change.  HENT:  Negative for hearing loss.   Eyes:  Negative for visual disturbance.  Respiratory:  Positive for chest tightness (monitored by cardiology). Negative for cough, shortness of breath and wheezing.   Cardiovascular:  Negative for chest pain, palpitations and leg swelling.  Gastrointestinal:  Negative for abdominal distention, abdominal pain, blood in stool, constipation, diarrhea, nausea and vomiting.  Genitourinary:  Negative for difficulty urinating and hematuria.  Musculoskeletal:  Negative for arthralgias, myalgias and neck pain.  Skin:  Negative for rash.  Neurological:  Negative for dizziness, seizures, syncope and headaches.  Hematological:  Negative for adenopathy. Does not bruise/bleed easily.  Psychiatric/Behavioral:  Negative for dysphoric mood. The patient is not nervous/anxious.     Objective:  BP 134/82   Pulse 78   Temp (!) 97.3 F (36.3 C) (Temporal)   Ht 6' 2.5" (1.892 m)   Wt (!) 324 lb 2 oz (147 kg)   SpO2 96%   BMI 41.06 kg/m   Wt Readings from Last 3 Encounters:  11/28/22 (!) 324 lb 2 oz (147 kg)  11/04/22 (!) 325 lb 12.8 oz (147.8 kg)  07/29/22 (!) 335 lb 8 oz (152.2 kg)      Physical Exam Vitals and nursing note reviewed.  Constitutional:      General: He is not in acute distress.    Appearance: Normal appearance. He is well-developed. He is not ill-appearing.  HENT:     Head: Normocephalic and atraumatic.     Right Ear: Hearing, tympanic membrane, ear canal and external ear normal.     Left Ear: Hearing, tympanic membrane, ear canal and external ear normal.     Nose: Nose normal.     Mouth/Throat:     Mouth: Mucous membranes are moist.     Pharynx: Oropharynx is clear. No oropharyngeal exudate or  posterior oropharyngeal erythema.  Eyes:     General: No scleral icterus.    Extraocular Movements: Extraocular movements intact.     Conjunctiva/sclera: Conjunctivae normal.     Pupils: Pupils are equal, round, and reactive to light.  Neck:     Thyroid: No thyroid mass or thyromegaly.     Vascular: No carotid bruit.  Cardiovascular:     Rate and Rhythm: Normal rate and regular rhythm.     Pulses: Normal pulses.          Radial pulses are 2+ on the right side and 2+ on the left side.     Heart sounds: Normal heart sounds. No murmur heard. Pulmonary:     Effort:  Pulmonary effort is normal. No respiratory distress.     Breath sounds: Normal breath sounds. No wheezing, rhonchi or rales.  Abdominal:     General: Bowel sounds are normal. There is no distension.     Palpations: Abdomen is soft. There is no mass.     Tenderness: There is no abdominal tenderness. There is no guarding or rebound.     Hernia: No hernia is present.  Musculoskeletal:        General: Normal range of motion.     Cervical back: Normal range of motion and neck supple.     Right lower leg: No edema.     Left lower leg: No edema.  Lymphadenopathy:     Cervical: No cervical adenopathy.  Skin:    General: Skin is warm and dry.     Findings: No rash.  Neurological:     General: No focal deficit present.     Mental Status: He is alert and oriented to person, place, and time.  Psychiatric:        Mood and Affect: Mood normal.        Behavior: Behavior normal.        Thought Content: Thought content normal.        Judgment: Judgment normal.       Results for orders placed or performed in visit on 11/21/22  Comprehensive metabolic panel  Result Value Ref Range   Sodium 137 135 - 145 mEq/L   Potassium 3.5 3.5 - 5.1 mEq/L   Chloride 102 96 - 112 mEq/L   CO2 26 19 - 32 mEq/L   Glucose, Bld 222 (H) 70 - 99 mg/dL   BUN 11 6 - 23 mg/dL   Creatinine, Ser 4.09 0.40 - 1.50 mg/dL   Total Bilirubin 0.7 0.2 - 1.2  mg/dL   Alkaline Phosphatase 102 39 - 117 U/L   AST 15 0 - 37 U/L   ALT 15 0 - 53 U/L   Total Protein 6.8 6.0 - 8.3 g/dL   Albumin 3.9 3.5 - 5.2 g/dL   GFR 81.19 >14.78 mL/min   Calcium 8.7 8.4 - 10.5 mg/dL  Lipid panel  Result Value Ref Range   Cholesterol 93 0 - 200 mg/dL   Triglycerides 295.6 (H) 0.0 - 149.0 mg/dL   HDL 21.30 (L) >86.57 mg/dL   VLDL 84.6 0.0 - 96.2 mg/dL   LDL Cholesterol 35 0 - 99 mg/dL   Total CHOL/HDL Ratio 5    NonHDL 73.27   Microalbumin / creatinine urine ratio  Result Value Ref Range   Microalb, Ur <0.7 0.0 - 1.9 mg/dL   Creatinine,U 95.2 mg/dL   Microalb Creat Ratio 0.7 0.0 - 30.0 mg/g  Hemoglobin A1c  Result Value Ref Range   Hgb A1c MFr Bld 11.2 (H) 4.6 - 6.5 %  A1c 7.8% 07/2022, A1c 6.2% 01/2022  Lab Results  Component Value Date   PSA 0.85 07/26/2022   PSA 0.64 11/08/2021   PSA 0.75 09/07/2020   Assessment & Plan:   Problem List Items Addressed This Visit     Essential hypertension    Chronic, stable. Continue current regimen.       Relevant Medications   amLODipine (NORVASC) 10 MG tablet   olmesartan-hydrochlorothiazide (BENICAR HCT) 40-25 MG tablet   Obesity, Class III, BMI 40-49.9 (morbid obesity) (HCC)    Encouraged healthy diet and lifestyle choices to affect sustainable weight loss. Encouraged he discuss GLP1RA option with endo.  Encounter for general adult medical examination with abnormal findings - Primary (Chronic)    Preventative protocols reviewed and updated unless pt declined. Discussed healthy diet and lifestyle.       Skin rash    Bilateral axilla in setting of worsened sugar control - anticipate axillary intertrigo - start lotrimin BID x 3-4 wks, update with effect      Type 2 diabetes mellitus with other specified complication (HCC)    Chronic, deteriorated control with A1c up to 11.2%!  He states he's only been taking metformin IR 500mg  BID - but med list shows metformin XR 1000mg  BID from last endo  note. I can't see endo records - have requested today.  I did ask him to call Dr Willeen Cass office to verify dose of metformin he should be on, as well as to schedule follow up OV for deteriorated diabetes control.  Discussed FMLA use.       Relevant Medications   olmesartan-hydrochlorothiazide (BENICAR HCT) 40-25 MG tablet   Dyslipidemia associated with type 2 diabetes mellitus (HCC)    LDL 35 off medication. Discussed statin indication. He declines statin, despite being diabetic.  The ASCVD Risk score (Arnett DK, et al., 2019) failed to calculate for the following reasons:   The valid HDL cholesterol range is 20 to 100 mg/dL   The valid total cholesterol range is 130 to 320 mg/dL       Relevant Medications   olmesartan-hydrochlorothiazide (BENICAR HCT) 40-25 MG tablet   OSA (obstructive sleep apnea)    Saw [facility] pulm 05/2021 followed by sleep study showing moderate OSA.  Intolerant to CPAP, doesn't want to retry. Not [facility]ire candidate due to elevated BMI.  Will call dentist for evaluation of oral appliance.       1st degree AV block    By EKG - avoid AV nodal blocking agents       Relevant Medications   amLODipine (NORVASC) 10 MG tablet   olmesartan-hydrochlorothiazide (BENICAR HCT) 40-25 MG tablet   Cutaneous skin tags    Bilateral axilla. Reviewed these can be a sign of insulin resistance. He requests derm referral to discuss removal.       Relevant Orders   Ambulatory referral to Dermatology     Meds ordered this encounter  Medications   amLODipine (NORVASC) 10 MG tablet    Sig: Take 1 tablet (10 mg total) by mouth daily. For blood presusre    Dispense:  90 tablet    Refill:  4   olmesartan-hydrochlorothiazide (BENICAR HCT) 40-25 MG tablet    Sig: Take 1 tablet by mouth daily. For blood pressure    Dispense:  90 tablet    Refill:  4    Orders Placed This Encounter  Procedures   Ambulatory referral to Dermatology    Referral Priority:   Routine    Referral  Type:   Consultation    Referral Reason:   Specialty Services Required    Requested Specialty:   Dermatology    Number of Visits Requested:   1    Patient Instructions  Sign release for records from Endocrinologist Dr Talmage Nap. Call Dr Talmage Nap to verify how you're supposed to be taking metformin (and immediate vs extended release).  We will refer you to dermatologist in [place] to discuss skin tags - which can be a sign of insulin resistance.  Try clotrimazole (lotrimin) over the counter antifungal twice daily under arms for 3-4 weeks or as needed.  Let us know if worsening.  Return as needed or in 3-4 months for follow up visit   Follow up plan: Return in about 4 months (around 03/31/2023), or if symptoms worsen or fail to improve, for follow up visit.  Eustaquio Boyden, MD

## 2022-11-28 NOTE — Patient Instructions (Addendum)
Sign release for records from Endocrinologist Dr Talmage Nap. Call Dr Talmage Nap to verify how you're supposed to be taking metformin (and immediate vs extended release).  We will refer you to dermatologist in [place] to discuss skin tags - which can be a sign of insulin resistance.  Try clotrimazole (lotrimin) over the counter antifungal twice daily under arms for 3-4 weeks or as needed.  Let us know if worsening.  Return as needed or in 3-4 months for follow up visit

## 2022-11-28 NOTE — Assessment & Plan Note (Signed)
Chronic, stable. Continue current regimen. 

## 2022-11-28 NOTE — Assessment & Plan Note (Signed)
Preventative protocols reviewed and updated unless pt declined. Discussed healthy diet and lifestyle.  

## 2022-11-28 NOTE — Assessment & Plan Note (Addendum)
Chronic, deteriorated control with A1c up to 11.2%!  He states he's only been taking metformin IR 500mg  BID - but med list shows metformin XR 1000mg  BID from last endo note. I can't see endo records - have requested today.  I did ask him to call Dr Willeen Cass office to verify dose of metformin he should be on, as well as to schedule follow up OV for deteriorated diabetes control.  Discussed FMLA use.

## 2022-11-28 NOTE — Assessment & Plan Note (Signed)
LDL 35 off medication. Discussed statin indication. He declines statin, despite being diabetic.  The ASCVD Risk score (Arnett DK, et al., 2019) failed to calculate for the following reasons:   The valid HDL cholesterol range is 20 to 100 mg/dL   The valid total cholesterol range is 130 to 320 mg/dL

## 2022-12-22 ENCOUNTER — Encounter: Payer: Self-pay | Admitting: Family Medicine

## 2023-01-12 ENCOUNTER — Other Ambulatory Visit: Payer: Self-pay | Admitting: Family Medicine

## 2023-01-12 DIAGNOSIS — I10 Essential (primary) hypertension: Secondary | ICD-10-CM

## 2023-01-12 NOTE — Telephone Encounter (Signed)
Too soon. Rxs sent on 11/28/22, #90/4 to [facility].   Requests denied.

## 2023-01-23 LAB — HEMOGLOBIN A1C: Hemoglobin A1C: 6.4

## 2023-02-08 ENCOUNTER — Other Ambulatory Visit: Payer: Self-pay | Admitting: Family Medicine

## 2023-02-08 DIAGNOSIS — E1169 Type 2 diabetes mellitus with other specified complication: Secondary | ICD-10-CM

## 2023-02-11 ENCOUNTER — Other Ambulatory Visit: Payer: Self-pay | Admitting: Family Medicine

## 2023-02-11 DIAGNOSIS — I10 Essential (primary) hypertension: Secondary | ICD-10-CM

## 2023-02-13 NOTE — Telephone Encounter (Signed)
Too soon. Rxs for both olmesartan-hctz and amlodipine sent on 11/28/22, #90/4 to [facility] mail order pharmacy.   Requests denied.

## 2023-02-18 ENCOUNTER — Other Ambulatory Visit: Payer: Self-pay | Admitting: Family Medicine

## 2023-02-18 DIAGNOSIS — I10 Essential (primary) hypertension: Secondary | ICD-10-CM

## 2023-02-21 NOTE — Telephone Encounter (Signed)
Too soon. Rx sent on 11/28/22, #90/4 to [facility] mail order pharmacy.  Request denied.

## 2023-02-27 ENCOUNTER — Encounter: Payer: Self-pay | Admitting: Family Medicine

## 2023-02-27 DIAGNOSIS — I10 Essential (primary) hypertension: Secondary | ICD-10-CM

## 2023-02-27 DIAGNOSIS — E1169 Type 2 diabetes mellitus with other specified complication: Secondary | ICD-10-CM

## 2023-02-27 MED ORDER — OLMESARTAN MEDOXOMIL-HCTZ 40-25 MG PO TABS
1.0000 | ORAL_TABLET | Freq: Every day | ORAL | 3 refills | Status: DC
Start: 2023-02-27 — End: 2023-12-11

## 2023-02-27 MED ORDER — AMLODIPINE BESYLATE 10 MG PO TABS
10.0000 mg | ORAL_TABLET | Freq: Every day | ORAL | 3 refills | Status: DC
Start: 2023-02-27 — End: 2024-03-08

## 2023-02-27 MED ORDER — GLIMEPIRIDE 2 MG PO TABS
2.0000 mg | ORAL_TABLET | Freq: Every day | ORAL | 3 refills | Status: DC
Start: 2023-02-27 — End: 2023-06-02

## 2023-02-27 NOTE — Telephone Encounter (Signed)
E-scribed refill to [facility].

## 2023-02-27 NOTE — Addendum Note (Signed)
Addended by: Nanci Pina on: 02/27/2023 10:46 AM   Modules accepted: Orders

## 2023-02-27 NOTE — Telephone Encounter (Signed)
Updated pt's pharmacy list.   E-scribed rx to glimepiride and olmesartan-hydrochlorothiazide to [facility].

## 2023-03-09 ENCOUNTER — Encounter (INDEPENDENT_AMBULATORY_CARE_PROVIDER_SITE_OTHER): Payer: Self-pay

## 2023-03-13 ENCOUNTER — Encounter: Payer: Self-pay | Admitting: Podiatry

## 2023-03-13 ENCOUNTER — Ambulatory Visit (INDEPENDENT_AMBULATORY_CARE_PROVIDER_SITE_OTHER): Payer: 59 | Admitting: Podiatry

## 2023-03-13 DIAGNOSIS — B351 Tinea unguium: Secondary | ICD-10-CM | POA: Diagnosis not present

## 2023-03-13 DIAGNOSIS — M79675 Pain in left toe(s): Secondary | ICD-10-CM

## 2023-03-13 DIAGNOSIS — E1169 Type 2 diabetes mellitus with other specified complication: Secondary | ICD-10-CM | POA: Diagnosis not present

## 2023-03-13 DIAGNOSIS — M79674 Pain in right toe(s): Secondary | ICD-10-CM

## 2023-03-13 NOTE — Progress Notes (Signed)
This patient returns to my office for at risk foot care.  This patient requires this care by a professional since this patient will be at risk due to having type 2 diabetes.    This patient is unable to cut nails himself since the patient cannot reach his nails.These nails are painful walking and wearing shoes.  This patient presents for at risk foot care today.  General Appearance  Alert, conversant and in no acute stress.  Vascular  Dorsalis pedis and posterior tibial  pulses are palpable  bilaterally.  Capillary return is within normal limits  bilaterally. Temperature is within normal limits  bilaterally.  Neurologic  Senn-Weinstein monofilament wire test within normal limits  bilaterally. Muscle power within normal limits bilaterally.  Nails Thick disfigured discolored nails with subungual debris  hallux nails bilaterally. No evidence of bacterial infection or drainage bilaterally.  Orthopedic  No limitations of motion  feet .  No crepitus or effusions noted.  No bony pathology or digital deformities noted.  Midfoot  DJD  B/L.  Skin  normotropic skin with no porokeratosis noted bilaterally.  No signs of infections or ulcers noted.     Onychomycosis  Pain in right toes  Pain in left toes  Consent was obtained for treatment procedures.   Mechanical debridement of nails 1-5  bilaterally performed with a nail nipper.  Filed with dremel without incident.    Return office visit    6  months                  Told patient to return for periodic foot care and evaluation due to potential at risk complications.   Gardiner Barefoot DPM

## 2023-03-15 ENCOUNTER — Telehealth: Payer: Self-pay

## 2023-03-15 NOTE — Telephone Encounter (Signed)
Appt scheduled 03/31/2023

## 2023-03-15 NOTE — Transitions of Care (Post Inpatient/ED Visit) (Signed)
   03/15/2023  Name: Jonathan Compton MRN: [id number] DOB: May 26, 1971  Today's TOC FU Call Status: Today's TOC FU Call Status:: Successful TOC FU Call Completed TOC FU Call Complete Date: 03/15/23  Transition Care Management Follow-up Telephone Call Date of Discharge: 03/14/23 Discharge Facility: Other ([facility]) Name of Other ([facility]) [facility]charge Facility: [facility] Type of Discharge: Emergency Department Reason for ED Visit: Other: (Abdominal Pain) How have you been since you were released from the hospital?: Better Any questions or concerns?: No  Items Reviewed: Did you receive and understand the discharge instructions provided?: Yes Medications obtained,verified, and reconciled?: Yes (Medications Reviewed) Any new allergies since your discharge?: No Dietary orders reviewed?: Yes Do you have support at home?: Yes  Medications Reviewed Today: Medications Reviewed Today     Reviewed by Merleen Nicely, LPN (Licensed Practical Nurse) on 03/15/23 at 1135  Med List Status: <None>   Medication Order Taking? Sig Documenting Provider Last Dose Status Informant  amLODipine (NORVASC) 10 MG tablet [id number] Yes Take 1 tablet (10 mg total) by mouth daily. For blood presusre Eustaquio Boyden, MD Taking Active   Blood Pressure Monitoring (BLOOD PRESSURE CUFF) MISC [id number] Yes Use as directed to check blood pressures daily or as needed. XL cuff Eustaquio Boyden, MD Taking Active   Continuous Blood Gluc Sensor (FREESTYLE LIBRE 3 SENSOR) [place] [id number] Yes USE AS DIRECTED CHANGE EVERY 2 WEEKS 28 Eustaquio Boyden, MD Taking Active   famotidine (PEPCID) 20 MG tablet [id number] Yes Take 1 tablet (20 mg total) by mouth 2 (two) times daily. Eustaquio Boyden, MD Taking Active   glimepiride (AMARYL) 2 MG tablet [id number] Yes Take 1 tablet (2 mg total) by mouth daily. Eustaquio Boyden, MD Taking Active   metFORMIN (GLUCOPHAGE-XR) 500 MG 24 hr tablet [id number] Yes Take 2  tablets (1,000 mg total) by mouth 2 (two) times daily with a meal. Eustaquio Boyden, MD Taking Active   olmesartan-hydrochlorothiazide [facility] HCT) 40-25 MG tablet [id number] Yes Take 1 tablet by mouth daily. For blood pressure Eustaquio Boyden, MD Taking Active             Home Care and Equipment/Supplies: Were Home Health Services Ordered?: NA Any new equipment or medical supplies ordered?: NA  Functional Questionnaire: Do you need assistance with bathing/showering or dressing?: No Do you need assistance with meal preparation?: No Do you need assistance with eating?: No Do you have difficulty maintaining continence: No Do you need assistance with getting out of bed/getting out of a chair/moving?: No Do you have difficulty managing or taking your medications?: No  Follow up appointments reviewed: PCP Follow-up appointment confirmed?: Yes MD Provider Line Number:[phone] Given: Yes Date of PCP follow-up appointment?: 04/14/23 Follow-up Provider: [facility] Follow-up appointment confirmed?: NA Do you need transportation to your follow-up appointment?: No Do you understand care options if your condition(s) worsen?: Yes-patient verbalized understanding    SIGNATURE  Woodfin Ganja LPN [facility] Nurse Health Advisor Direct Dial [phone]

## 2023-03-16 ENCOUNTER — Encounter: Payer: 59 | Admitting: Family Medicine

## 2023-03-17 ENCOUNTER — Encounter: Payer: 59 | Admitting: Family Medicine

## 2023-03-31 ENCOUNTER — Encounter: Payer: Self-pay | Admitting: Family Medicine

## 2023-03-31 ENCOUNTER — Ambulatory Visit: Payer: 59 | Admitting: Family Medicine

## 2023-03-31 DIAGNOSIS — E1169 Type 2 diabetes mellitus with other specified complication: Secondary | ICD-10-CM

## 2023-03-31 NOTE — Telephone Encounter (Signed)
Fyi to Dr. G 

## 2023-06-02 ENCOUNTER — Ambulatory Visit (INDEPENDENT_AMBULATORY_CARE_PROVIDER_SITE_OTHER): Payer: 59 | Admitting: Family Medicine

## 2023-06-02 VITALS — BP 126/72 | HR 95 | Temp 97.8°F | Ht 74.5 in | Wt 320.2 lb

## 2023-06-02 DIAGNOSIS — Z7984 Long term (current) use of oral hypoglycemic drugs: Secondary | ICD-10-CM

## 2023-06-02 DIAGNOSIS — E1169 Type 2 diabetes mellitus with other specified complication: Secondary | ICD-10-CM | POA: Diagnosis not present

## 2023-06-02 MED ORDER — FREESTYLE LIBRE 3 PLUS SENSOR MISC: 11 refills | Status: DC

## 2023-06-02 NOTE — Patient Instructions (Addendum)
Congrats on improved sugar control! Continue current medicines, continue low sugar low salt diabetic diet.  Good to see you today Return as needed or in 6 months for physical.

## 2023-06-02 NOTE — Progress Notes (Signed)
Ph: [phone] Fax: [phone]   Patient ID: Jonathan Compton, male    DOB: 08-14-1970, 52 y.o.   MRN: [id number]  This visit was conducted in person.  BP 126/72   Pulse 95   Temp 97.8 F (36.6 C) (Oral)   Ht 6' 2.5" (1.892 m)   Wt (!) 320 lb 4 oz (145.3 kg)   SpO2 98%   BMI 40.57 kg/m    CC: 57mo f/u visit  Subjective:   HPI: Jonathan Compton is a 52 y.o. male presenting on 06/02/2023 for Medical Management of Chronic Issues (Here for f/u. States he was seen by endo this AM and they monitor A1c. Also, states no longer using FreeStyle 3 sensor. )   OSA intolerant to CPAP. Saw ENT Jenne Pane, not candidate for Inspire. Planning dental eval for oral appliance.   HTN, h/o pericarditis, 1st degree AVB, followed by cardiology.   DM - does not regularly check sugars. Last week 150. Compliant with antihyperglycemic regimen which includes: amaryl 2mg  daily, metformin XR 1000mg  bid. Denies low sugars or hypoglycemic symptoms. Denies paresthesias, blurry vision. Last diabetic eye exam 11/2022. Glucometer brand: unsure. Last foot exam: DUE. DSME: 2017. CGM data: Freestyle Libre 2 - doesn't have on currently Lab Results  Component Value Date   HGBA1C 6.4 01/23/2023  A1c 6.4% (01/2023) Diabetic Foot Exam - Simple   Simple Foot Form Diabetic Foot exam was performed with the following findings: Yes 06/02/2023 12:47 PM  Visual Inspection See comments: Yes Sensation Testing Intact to touch and monofilament testing bilaterally: Yes Pulse Check Posterior Tibialis and Dorsalis pulse intact bilaterally: Yes Comments No claudication Maceration between toes bilaterally    Lab Results  Component Value Date   MICROALBUR <0.7 11/21/2022         Relevant past medical, surgical, family and social history reviewed and updated as indicated. Interim medical history since our last visit reviewed. Allergies and medications reviewed and updated. Outpatient Medications Prior to Visit  Medication  Sig Dispense Refill   amLODipine (NORVASC) 10 MG tablet Take 1 tablet (10 mg total) by mouth daily. For blood presusre 90 tablet 3   Blood Pressure Monitoring (BLOOD PRESSURE CUFF) MISC Use as directed to check blood pressures daily or as needed. XL cuff 1 each 0   famotidine (PEPCID) 20 MG tablet Take 1 tablet (20 mg total) by mouth 2 (two) times daily. 180 tablet 3   metFORMIN (GLUCOPHAGE-XR) 500 MG 24 hr tablet Take 2 tablets (1,000 mg total) by mouth 2 (two) times daily with a meal.     olmesartan-hydrochlorothiazide (BENICAR HCT) 40-25 MG tablet Take 1 tablet by mouth daily. For blood pressure 90 tablet 3   glimepiride (AMARYL) 2 MG tablet Take 1 tablet (2 mg total) by mouth daily. 90 tablet 3   glimepiride (AMARYL) 2 MG tablet Take 1 tablet (2 mg total) by mouth in the morning and at bedtime. For diabetes through endocrinology     Continuous Blood Gluc Sensor (FREESTYLE LIBRE 3 SENSOR) MISC USE AS DIRECTED CHANGE EVERY 2 WEEKS 28 (Patient not taking: Reported on 06/02/2023) 6 each 1   No facility-administered medications prior to visit.     Per HPI unless specifically indicated in ROS section below Review of Systems  Objective:  BP 126/72   Pulse 95   Temp 97.8 F (36.6 C) (Oral)   Ht 6' 2.5" (1.892 m)   Wt (!) 320 lb 4 oz (145.3 kg)   SpO2 98%   BMI  40.57 kg/m   Wt Readings from Last 3 Encounters:  06/02/23 (!) 320 lb 4 oz (145.3 kg)  11/28/22 (!) 324 lb 2 oz (147 kg)  11/04/22 (!) 325 lb 12.8 oz (147.8 kg)      Physical Exam Vitals and nursing note reviewed.  Constitutional:      Appearance: Normal appearance. He is not ill-appearing.  Eyes:     Extraocular Movements: Extraocular movements intact.     Conjunctiva/sclera: Conjunctivae normal.     Pupils: Pupils are equal, round, and reactive to light.  Cardiovascular:     Rate and Rhythm: Normal rate and regular rhythm.     Pulses: Normal pulses.     Heart sounds: Normal heart sounds. No murmur heard. Pulmonary:      Effort: Pulmonary effort is normal. No respiratory distress.     Breath sounds: Normal breath sounds. No wheezing, rhonchi or rales.  Musculoskeletal:     Right lower leg: No edema.     Left lower leg: No edema.     Comments: See HPI for foot exam if done  Skin:    General: Skin is warm and dry.     Findings: No rash.  Neurological:     Mental Status: He is alert.  Psychiatric:        Mood and Affect: Mood normal.        Behavior: Behavior normal.       Results for orders placed or performed in visit on 06/02/23  Hemoglobin A1c  Result Value Ref Range   Hemoglobin A1C 6.4    Lab Results  Component Value Date   NA 137 11/21/2022   CL 102 11/21/2022   K 3.5 11/21/2022   CO2 26 11/21/2022   BUN 11 11/21/2022   CREATININE 0.86 11/21/2022   GFR 99.48 11/21/2022   CALCIUM 8.7 11/21/2022   PHOS 3.7 03/05/2010   ALBUMIN 3.9 11/21/2022   GLUCOSE 222 (H) 11/21/2022    Assessment & Plan:   Problem List Items Addressed This Visit     Type 2 diabetes mellitus with other specified complication (HCC) - Primary    Chronic, marked improvement based on latest A1c. He has cut down on soda intake. Appreciate endo care - continue current regimen.       Relevant Medications   glimepiride (AMARYL) 2 MG tablet     Meds ordered this encounter  Medications   Continuous Glucose Sensor (FREESTYLE LIBRE 3 PLUS SENSOR) MISC    Sig: Use to check sugars daily. Change sensor every 15 days.    Dispense:  2 each    Refill:  11    Orders Placed This Encounter  Procedures   Hemoglobin A1c    This external order was created through the Results Console.    Patient Instructions  Congrats on improved sugar control! Continue current medicines, continue low sugar low salt diabetic diet.  Good to see you today Return as needed or in 6 months for physical.   Follow up plan: Return in about 6 months (around 11/30/2023) for annual exam, prior fasting for blood work.  Jonathan Boyden, MD

## 2023-06-02 NOTE — Assessment & Plan Note (Signed)
Chronic, marked improvement based on latest A1c. He has cut down on soda intake. Appreciate endo care - continue current regimen.

## 2023-06-28 ENCOUNTER — Ambulatory Visit (INDEPENDENT_AMBULATORY_CARE_PROVIDER_SITE_OTHER): Payer: 59 | Admitting: Dermatology

## 2023-06-28 ENCOUNTER — Encounter: Payer: Self-pay | Admitting: Dermatology

## 2023-06-28 VITALS — BP 137/90 | HR 83

## 2023-06-28 DIAGNOSIS — L918 Other hypertrophic disorders of the skin: Secondary | ICD-10-CM | POA: Diagnosis not present

## 2023-06-28 NOTE — Patient Instructions (Addendum)
Hello Jonathan Compton,  Thank you for visiting my office today. Below is a summary of the essential instructions from our appointment today:  - Skin Tag Removal: Today, we performed the removal of several skin tags. This procedure involved numbing, cutting, and cauterizing the areas. It is important to note that this is considered a cosmetic procedure and is not covered by insurance.   - Post-Procedure Care:     - Application: Apply Aquaphor healing ointment as provided.     - Moisturization: Use Vaseline to keep the area moist.     - Soreness Management: Apply cold packs if you experience soreness after the numbing wears off.     - Showering: When showering, use soap gently on the treated areas.     - Healing: Healing is expected within 2-3 weeks.   - Preventive Advice: It is crucial to maintain control of your blood sugar to prevent the formation of new skin tags.  Should you have any questions or concerns about your treatment today or your ongoing health management, please do not hesitate to reach out.  Best regards,  Dr. Langston Reusing [facility]at[facility]  Important Information   Due to recent changes in healthcare laws, you may see results of your pathology and/or laboratory studies on MyChart before the doctors have had a chance to review them. We understand that in some cases there may be results that are confusing or concerning to you. Please understand that not all results are received at the same time and often the doctors may need to interpret multiple results in order to provide you with the best plan of care or course of treatment. Therefore, we ask that you please give [place] 2 business days to thoroughly review all your results before contacting the office for clarification. Should we see a critical lab result, you will be contacted sooner.     If You Need Anything After Your Visit   If you have any questions or concerns for your doctor, please call our main line at [phone]. If no one  answers, please leave a voicemail as directed and we will return your call as soon as possible. Messages left after 4 pm will be answered the following business day.    You may also send [place] a message via MyChart. We typically respond to MyChart messages within 1-2 business days.  For prescription refills, please ask your pharmacy to contact our office. Our fax number is [phone].  If you have an urgent issue when the clinic is closed that cannot wait until the next business day, you can page your doctor at the number below.     Please note that while we do our best to be available for urgent issues outside of office hours, we are not available 24/7.    If you have an urgent issue and are unable to reach [place], you may choose to seek medical care at your doctor's office, retail clinic, urgent care center, or emergency room.   If you have a medical emergency, please immediately call 911 or go to the emergency department. In the event of inclement weather, please call our main line at [phone] for an update on the status of any delays or closures.  Dermatology Medication Tips: Please keep the boxes that topical medications come in in order to help keep track of the instructions about where and how to use these. Pharmacies typically print the medication instructions only on the boxes and not directly on the medication tubes.  If your medication is too expensive, please contact our office at [phone] or send [place] a message through MyChart.    We are unable to tell what your co-pay for medications will be in advance as this is different depending on your insurance coverage. However, we may be able to find a substitute medication at lower cost or fill out paperwork to get insurance to cover a needed medication.    If a prior authorization is required to get your medication covered by your insurance company, please allow [place] 1-2 business days to complete this process.   Drug prices often vary  depending on where the prescription is filled and some pharmacies may offer cheaper prices.   The website www.goodrx.com contains coupons for medications through different pharmacies. The prices here do not account for what the cost may be with help from insurance (it may be cheaper with your insurance), but the website can give you the price if you did not use any insurance.  - You can print the associated coupon and take it with your prescription to the pharmacy.  - You may also stop by our office during regular business hours and pick up a GoodRx coupon card.  - If you need your prescription sent electronically to a different pharmacy, notify our office through [facility] or by phone at [phone]

## 2023-06-28 NOTE — Progress Notes (Signed)
   New Patient Visit   Subjective  Jonathan Compton is a 52 y.o. male who presents for the following: Skin Tag  Patient states he has skin tags located at the scattered that he  would like to have examined. Patient reports the areas have been there for several years. He reports the areas are bothersome. Patient rates irritation 5 out of 10. He states that the areas have spread. Patient reports he  has not previously been treated for these areas. Patient reports he has dx if DM II and his last A1c was last month measuring at 5.1.  The following portions of the chart were reviewed this encounter and updated as appropriate: medications, allergies, medical history  Review of Systems:  No other skin or systemic complaints except as noted in HPI or Assessment and Plan.  Objective  Well appearing patient in no apparent distress; mood and affect are within normal limits.  A focused examination was performed of the following areas: B/L Axilla, flanks and neck  Relevant exam findings are noted in the Assessment and Plan.  Left Anterior Neck (2), Left Flank (6), Right Flank (4)      Assessment & Plan   Acrochordons (Skin Tags) - Fleshy, skin-colored pedunculated papules - Benign appearing.  - Observe. - If desired, they can be removed with an in office procedure that is not covered by insurance. - Please call the clinic if you notice any new or changing lesions.   Other Procedure(Skin Tags): Quote for cosmetic removal:  $200 to remove up to 15 Skin tag (12) Left Flank (6); Right Flank (4); Left Anterior Neck (2)  Skin tag removal generally is considered cosmetic and not covered by insurance.  Cosmetic removal fee is $200 for up to 15 tags removed. You can contact your insurance to see if skin tag removal is a medically necessary covered benefit.  Even if covered, copay and deductible payments would apply. CPT code: 41660 Diagnosis code: L91.8  Pt ok paying for cosmetic  removal  Procedure: Skin tag removal Informed consent:  Discussed risks (permanent scarring, infection, pain, bleeding, bruising, redness, and recurrence of the lesion) and benefits of the procedure, as well as the alternatives.  He is aware that skin tags are benign lesions, and their removal is often not considered medically necessary.  Informed consent was obtained.  Anesthesia: lidocaine with epi  The area was prepared and draped in a standard fashion.  Snip removal was performed.    Antibiotic ointment and a sterile dressing were applied.    The patient tolerated procedure well. The patient was instructed on post-op care.   Number of lesions removed:  15     Return if symptoms worsen or fail to improve.    Documentation: I have reviewed the above documentation for accuracy and completeness, and I agree with the above.  Stasia Cavalier, am acting as scribe for Langston Reusing, DO.  Langston Reusing, DO

## 2023-07-24 ENCOUNTER — Ambulatory Visit: Payer: 59 | Admitting: Dermatology

## 2023-09-11 ENCOUNTER — Ambulatory Visit: Payer: 59 | Admitting: Podiatry

## 2023-09-12 ENCOUNTER — Other Ambulatory Visit: Payer: Self-pay | Admitting: Family Medicine

## 2023-10-02 ENCOUNTER — Ambulatory Visit (INDEPENDENT_AMBULATORY_CARE_PROVIDER_SITE_OTHER): Payer: 59 | Admitting: Podiatry

## 2023-10-02 ENCOUNTER — Encounter: Payer: Self-pay | Admitting: Podiatry

## 2023-10-02 DIAGNOSIS — M79675 Pain in left toe(s): Secondary | ICD-10-CM | POA: Diagnosis not present

## 2023-10-02 DIAGNOSIS — B351 Tinea unguium: Secondary | ICD-10-CM | POA: Diagnosis not present

## 2023-10-02 DIAGNOSIS — M79674 Pain in right toe(s): Secondary | ICD-10-CM

## 2023-10-02 DIAGNOSIS — E1169 Type 2 diabetes mellitus with other specified complication: Secondary | ICD-10-CM | POA: Diagnosis not present

## 2023-10-02 NOTE — Progress Notes (Signed)
 This patient returns to my office for at risk foot care.  This patient requires this care by a professional since this patient will be at risk due to having type 2 diabetes.    This patient is unable to cut nails himself since the patient cannot reach his nails.These nails are painful walking and wearing shoes.  This patient presents for at risk foot care today.  General Appearance  Alert, conversant and in no acute stress.  Vascular  Dorsalis pedis and posterior tibial  pulses are palpable  bilaterally.  Capillary return is within normal limits  bilaterally. Temperature is within normal limits  bilaterally.  Neurologic  Senn-Weinstein monofilament wire test within normal limits  bilaterally. Muscle power within normal limits bilaterally.  Nails Thick disfigured discolored nails with subungual debris  hallux nails bilaterally. No evidence of bacterial infection or drainage bilaterally.  Orthopedic  No limitations of motion  feet .  No crepitus or effusions noted.  No bony pathology or digital deformities noted.  Midfoot  DJD  B/L.  Skin  normotropic skin with no porokeratosis noted bilaterally.  No signs of infections or ulcers noted.     Onychomycosis  Pain in right toes  Pain in left toes  Consent was obtained for treatment procedures.   Mechanical debridement of nails 1-5  bilaterally performed with a nail nipper.  Filed with dremel without incident.    Return office visit    4  months                  Told patient to return for periodic foot care and evaluation due to potential at risk complications.   Helane Gunther DPM

## 2023-10-04 ENCOUNTER — Other Ambulatory Visit (HOSPITAL_COMMUNITY): Payer: Self-pay

## 2023-10-04 NOTE — Telephone Encounter (Signed)
 Pt is not currently using insulin therapy which is required for coverage of CGM products.

## 2023-10-04 NOTE — Telephone Encounter (Signed)
Please sign off on rx in this encounter as PA team is unable to resolve RX requests. Thank you

## 2023-11-24 ENCOUNTER — Other Ambulatory Visit: Payer: 59

## 2023-12-01 ENCOUNTER — Encounter: Payer: 59 | Admitting: Family Medicine

## 2023-12-09 ENCOUNTER — Other Ambulatory Visit: Payer: Self-pay | Admitting: Family Medicine

## 2023-12-09 DIAGNOSIS — I10 Essential (primary) hypertension: Secondary | ICD-10-CM

## 2023-12-24 ENCOUNTER — Other Ambulatory Visit: Payer: Self-pay | Admitting: Family Medicine

## 2023-12-24 DIAGNOSIS — E1169 Type 2 diabetes mellitus with other specified complication: Secondary | ICD-10-CM

## 2023-12-24 DIAGNOSIS — Z125 Encounter for screening for malignant neoplasm of prostate: Secondary | ICD-10-CM

## 2023-12-25 ENCOUNTER — Other Ambulatory Visit: Payer: 59

## 2024-01-01 ENCOUNTER — Encounter: Payer: 59 | Admitting: Family Medicine

## 2024-02-05 ENCOUNTER — Ambulatory Visit: Admitting: Podiatry

## 2024-03-01 ENCOUNTER — Encounter: Payer: Self-pay | Admitting: Family Medicine

## 2024-03-04 NOTE — Telephone Encounter (Signed)
 Spoke with pt relaying Dr Talmadge message. Pt verbalizes understanding and states he will contact Dr Becki office and take FMLA form to them to see about getting it completed.

## 2024-03-08 ENCOUNTER — Other Ambulatory Visit: Payer: Self-pay | Admitting: Family Medicine

## 2024-03-08 DIAGNOSIS — I10 Essential (primary) hypertension: Secondary | ICD-10-CM

## 2024-03-08 NOTE — Telephone Encounter (Addendum)
 I spoke with the patient, he is going to reach out to Dr. Becki office to have them fax over the needed information for Dr. KANDICE to fill out his FMLA forms. The patient was given [phone] fax number for the information to be sent.

## 2024-03-08 NOTE — Telephone Encounter (Signed)
 Pt needs appt with Dr Rilla for Diabetes and blood pressure. Can be a CPE if there is a slot available. Please help him get scheduled. Thank you.

## 2024-03-08 NOTE — Telephone Encounter (Signed)
 Contacted pt to sch this year's cpe, per Dr. Talmadge request. Pt asked if our office received any faxes or updates from his endrocinologist's office regarding FMLA forms? Let pt know I was not able to locate any prev. notes regarding any updates about forms. Sent mychart message to pt with office fax number incase office needs to fax over anything that need's Dr. Talmadge completion. FYI

## 2024-03-08 NOTE — Telephone Encounter (Signed)
 Spoke to pt, sch cpe for 04/17/24

## 2024-03-09 ENCOUNTER — Other Ambulatory Visit: Payer: Self-pay | Admitting: Family Medicine

## 2024-03-09 DIAGNOSIS — E1169 Type 2 diabetes mellitus with other specified complication: Secondary | ICD-10-CM

## 2024-03-11 NOTE — Telephone Encounter (Signed)
 Should this refill come from endocrinology?

## 2024-03-12 ENCOUNTER — Telehealth: Payer: Self-pay | Admitting: Family Medicine

## 2024-03-12 NOTE — Telephone Encounter (Signed)
 Patient left FMLA ppwk to be completed by provider asked that it be faxed once completed placed up front in Erin's box for completion

## 2024-03-12 NOTE — Telephone Encounter (Signed)
 ERx  Future refills through endo Dr Tommas

## 2024-03-13 LAB — HM DIABETES EYE EXAM

## 2024-03-13 NOTE — Telephone Encounter (Signed)
 FMLA for Patient forms received for completion for patient. Patient has been informed that process may take up to 5 business days.  Employer Name [employer] Reason for being out: Blood Sugar Any inpatient care: NA Any Planned appointment?  Patient is requesting start date of 7.28.25 Patient is requesting end date of 9.8.25   Verified with patient that it is ok to leave Voicemail updates on  Mobile [phone] (mobile)  Patient would like to pick up copy in our office when form is completed.   Fax number form should be sent to is:  [employer] [phone]  [employer] [phone]  Sarah Vitellaro [phone]  Forms placed in providers box for review.

## 2024-03-14 NOTE — Telephone Encounter (Unsigned)
 Copied from CRM [id number]. Topic: General - Other >> Mar 14, 2024 10:13 AM Dedra B wrote: Reason for CRM: Pt called to speak with Rocky regarding FMLA paperwork. Please give pt a callback.

## 2024-03-15 NOTE — Telephone Encounter (Addendum)
 First 2 forms filled and in CMA box.  I have not filled out 3rd form yet - what workplace accommodations is he requesting?

## 2024-03-15 NOTE — Telephone Encounter (Signed)
 I spoke with the patient regarding the accommodation form. Patient states it is up to the providers discretion if accommodations need to change due to increased A1C.  Forms given to provider for review.

## 2024-03-15 NOTE — Telephone Encounter (Signed)
 Placed in Erin's box

## 2024-03-15 NOTE — Telephone Encounter (Signed)
 Form filled and returned to [place].  He needs to sign form to complete it.  Recommend non-driving work accommodations while hyperglycemic.  Work accommodations expected to be temporary from 02/19/2024 to 04/01/2024.

## 2024-03-15 NOTE — Telephone Encounter (Signed)
 Completed forms from [employer] [address] and [employer] complete and faxed.  Forms for HR completed by provider, patient is coming to our office on Monday to sign release of medical information, then forms will be faxed.  Copy of completed forms sent to scan  Pt will pick up  a copy of the forms at our front desk

## 2024-03-20 NOTE — Telephone Encounter (Signed)
 Filled out and in Jonathan Compton's box

## 2024-03-20 NOTE — Telephone Encounter (Signed)
 Patients asks that we mark through the intermittent leave, and leave the continuous leave as employer is questioning why both intermittent and continuous leave are stated on forms.   I will reach out to the patient when updated forms have been completed and faxed.  Forms placed in providers box for review.

## 2024-03-21 ENCOUNTER — Ambulatory Visit: Payer: Self-pay | Admitting: Family Medicine

## 2024-03-21 LAB — LAB REPORT - SCANNED

## 2024-03-21 NOTE — Telephone Encounter (Signed)
 Received and faxed updated forms to [phone]  Copy sent to scan  Notified pt forms have been faxed and ready to pick up at the front desk.

## 2024-04-16 LAB — HEMOGLOBIN A1C: Hemoglobin A1C: 8.4

## 2024-04-17 ENCOUNTER — Encounter: Payer: Self-pay | Admitting: Family Medicine

## 2024-04-17 ENCOUNTER — Ambulatory Visit (INDEPENDENT_AMBULATORY_CARE_PROVIDER_SITE_OTHER): Admitting: Family Medicine

## 2024-04-17 VITALS — BP 142/90 | HR 94 | Temp 98.4°F | Ht 74.5 in | Wt 300.1 lb

## 2024-04-17 DIAGNOSIS — Z125 Encounter for screening for malignant neoplasm of prostate: Secondary | ICD-10-CM

## 2024-04-17 DIAGNOSIS — I440 Atrioventricular block, first degree: Secondary | ICD-10-CM | POA: Diagnosis not present

## 2024-04-17 DIAGNOSIS — E1169 Type 2 diabetes mellitus with other specified complication: Secondary | ICD-10-CM

## 2024-04-17 DIAGNOSIS — Z0001 Encounter for general adult medical examination with abnormal findings: Secondary | ICD-10-CM

## 2024-04-17 DIAGNOSIS — R42 Dizziness and giddiness: Secondary | ICD-10-CM

## 2024-04-17 DIAGNOSIS — E785 Hyperlipidemia, unspecified: Secondary | ICD-10-CM

## 2024-04-17 DIAGNOSIS — Z8679 Personal history of other diseases of the circulatory system: Secondary | ICD-10-CM

## 2024-04-17 DIAGNOSIS — I10 Essential (primary) hypertension: Secondary | ICD-10-CM

## 2024-04-17 DIAGNOSIS — G4733 Obstructive sleep apnea (adult) (pediatric): Secondary | ICD-10-CM

## 2024-04-17 DIAGNOSIS — R0789 Other chest pain: Secondary | ICD-10-CM

## 2024-04-17 DIAGNOSIS — E538 Deficiency of other specified B group vitamins: Secondary | ICD-10-CM

## 2024-04-17 DIAGNOSIS — K219 Gastro-esophageal reflux disease without esophagitis: Secondary | ICD-10-CM

## 2024-04-17 NOTE — Telephone Encounter (Signed)
 Faxed completed workplace accomodation form to [phone]  Was able to obtain signature from patient, while at his appointment today, allowing release of medical information so forms could be faxed.  Copy was sent to scan  Copy was given to patient at his appointment today.

## 2024-04-17 NOTE — Patient Instructions (Addendum)
 Labs today  EKG today  Schedule appointment with dentist. Schedule appointment with the cardiologist.  Start walking routine I will order heart CT for further evaluation of coronary arteries.  Good to see you today  Return as needed or in 6 months for follow up visit.

## 2024-04-17 NOTE — Progress Notes (Unsigned)
 Ph: [phone] Fax: [phone]   Patient ID: Jonathan Compton, male    DOB: Sep 29, 1970, 53 y.o.   MRN: [id number]  This visit was conducted in person.  BP (!) 142/90   Pulse 94   Temp 98.4 F (36.9 C) (Oral)   Ht 6' 2.5 (1.892 m)   Wt (!) 300 lb 2 oz (136.1 kg)   SpO2 97%   BMI 38.02 kg/m   BP Readings from Last 3 Encounters:  04/17/24 (!) 142/90  06/28/23 (!) 137/90  06/02/23 126/72   CC: CPE Subjective:   HPI: Jonathan Compton is a 53 y.o. male presenting on 04/17/2024 for Annual Exam   Not fasting today - had pack of peanut M&Ms.   DM managed by endo Dr Tommas. Currently on glimepiride  4mg  bid, metformin  500mg  bid. Latest A1c 8.4 (yesterday). Mounjaor was unaffordable.  Most recently acute deteriorating in diabetic control. Planned RTC 4 mo DM f/u with endo.  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 Planning to return to work next week.   OSA - intolerant to CPAP. Not good inspire candidate per ENT. Oral appliance through dentist - he needs to see dentist about this.   HTN, h/o pericarditis, 1st degree AVB, followed by cardiology - overdue. Last seen 10/2022, trial doxazosin  however he stopped due to symptoms of dizziness, lightheadedness.   See at ER last month with vertigo with nausea/vomiting, diagnosed with positional vertigo. CTA head and neck reassuringly without abnormalities to cerebral vasculature. Treated with Epley with benefit. Vertigo has since resolved. Episodes lasted seconds to minutes but felt bad for several days.   Intermittent left upper chest discomfort described as dull ache, unknown triggers. No regular exercise. Recent EKG at ER reassuring.   Preventative: COLONOSCOPY 09/2015 WNL ([facility])  ESOPHAGOGASTRODUODENOSCOPY 09/2015 WNL  Prostate cancer screening - father with prostate cancer dx 67 currently  undergoing radiation. Continue yearly PSA (last checked 07/2022). No nocturia, weakening stream Lung cancer screening - not eligible. Flu - declines  COVID vaccine - J&J 11/2019, booster x1  Pneumovax 10/2017, prevnar-20 10/2021 Tdap 2014, 42023 Zostavax 06/2020  Shingrix - 08/2019, 06/2020  Seat belt use discussed  Sunscreen use discussed. No changing moles on skin. 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 Interim medical history since our last visit reviewed. Allergies and medications reviewed and updated. Outpatient Medications Prior to Visit  Medication Sig Dispense Refill   amLODipine  (NORVASC ) 10 MG tablet TAKE 1 TABLET (10 MG TOTAL) BY MOUTH DAILY. FOR BLOOD PRESUSRE 90 tablet 0   Blood Pressure Monitoring (BLOOD PRESSURE CUFF) MISC Use as directed to check blood pressures daily or as needed. XL cuff 1 each 0   famotidine  (PEPCID ) 20 MG tablet Take 1 tablet (20 mg total) by mouth 2 (two) times daily. 180 tablet 3   glimepiride  (AMARYL ) 2 MG tablet TAKE 1 TABLET BY MOUTH EVERY DAY 90  tablet 0   metFORMIN  (GLUCOPHAGE -XR) 500 MG 24 hr tablet Take 2 tablets (1,000 mg total) by mouth 2 (two) times daily with a meal.     olmesartan -hydrochlorothiazide  (BENICAR  HCT) 40-25 MG tablet TAKE 1 TABLET BY MOUTH DAILY FOR BLOOD PRESSURE 90 tablet 0   Continuous Glucose Sensor (FREESTYLE LIBRE 3 PLUS SENSOR) MISC USE TO CHECK SUGARS DAILY. CHANGE SENSOR EVERY 15 DAYS. 2 each 11   No facility-administered medications prior to visit.     Per HPI unless specifically  indicated in ROS section below Review of Systems  Constitutional:  Negative for activity change, appetite change, chills, fatigue, fever and unexpected weight change.  HENT:  Negative for hearing loss.   Eyes:  Negative for visual disturbance.  Respiratory:  Positive for chest tightness. Negative for cough, shortness of breath and wheezing.   Cardiovascular:  Negative for chest pain, palpitations and leg swelling.  Gastrointestinal:  Positive for nausea and vomiting (with vertigo). Negative for abdominal distention, abdominal pain, blood in stool, constipation and diarrhea.  Genitourinary:  Negative for difficulty urinating and hematuria.  Musculoskeletal:  Negative for arthralgias, myalgias and neck pain.  Skin:  Negative for rash.  Neurological:  Positive for dizziness (recent vertigo). Negative for seizures, syncope and headaches.  Hematological:  Negative for adenopathy. Does not bruise/bleed easily.  Psychiatric/Behavioral:  Negative for dysphoric mood. The patient is not nervous/anxious.     Objective:  BP (!) 142/90   Pulse 94   Temp 98.4 F (36.9 C) (Oral)   Ht 6' 2.5 (1.892 m)   Wt (!) 300 lb 2 oz (136.1 kg)   SpO2 97%   BMI 38.02 kg/m   Wt Readings from Last 3 Encounters:  04/17/24 (!) 300 lb 2 oz (136.1 kg)  06/02/23 (!) 320 lb 4 oz (145.3 kg)  11/28/22 (!) 324 lb 2 oz (147 kg)      Physical Exam Vitals and nursing note reviewed.  Constitutional:      General: He is not in acute distress.    Appearance: Normal appearance. He is well-developed. He is not ill-appearing.  HENT:     Head: Normocephalic and atraumatic.     Right Ear: Hearing, tympanic membrane, ear canal and external ear normal.     Left Ear: Hearing, tympanic membrane, ear canal and external ear normal.     Mouth/Throat:     Mouth: Mucous membranes are moist.     Pharynx: Oropharynx is clear. No oropharyngeal exudate or posterior oropharyngeal erythema.  Eyes:     General: No scleral icterus.     Extraocular Movements: Extraocular movements intact.     Conjunctiva/sclera: Conjunctivae normal.     Pupils: Pupils are equal, round, and reactive to light.  Neck:     Thyroid : No thyroid  mass or thyromegaly.     Vascular: No carotid bruit.  Cardiovascular:     Rate and Rhythm: Normal rate and regular rhythm.     Pulses: Normal pulses.          Radial pulses are 2+ on the right side and 2+ on the left side.     Heart sounds: Normal heart sounds. No murmur heard. Pulmonary:     Effort: Pulmonary effort is normal. No respiratory distress.     Breath sounds: Normal breath sounds. No wheezing, rhonchi or rales.  Abdominal:     General: Bowel sounds are normal. There is no distension.     Palpations: Abdomen is soft. There is no mass.  Tenderness: There is no abdominal tenderness. There is no guarding or rebound.     Hernia: No hernia is present.  Musculoskeletal:        General: Normal range of motion.     Cervical back: Normal range of motion and neck supple.     Right lower leg: No edema.     Left lower leg: No edema.  Lymphadenopathy:     Cervical: No cervical adenopathy.  Skin:    General: Skin is warm and dry.     Findings: No rash.  Neurological:     General: No focal deficit present.     Mental Status: He is alert and oriented to person, place, and time.  Psychiatric:        Mood and Affect: Mood normal.        Behavior: Behavior normal.        Thought Content: Thought content normal.        Judgment: Judgment normal.       Results for orders placed or performed in visit on 04/17/24  HM DIABETES EYE EXAM   Collection Time: 03/13/24 12:00 AM  Result Value Ref Range   HM Diabetic Eye Exam No Retinopathy No Retinopathy  Hemoglobin A1c   Collection Time: 04/16/24 12:00 AM  Result Value Ref Range   Hemoglobin A1C 8.4     Assessment & Plan:   Problem List Items Addressed This Visit     Encounter for general adult medical examination with abnormal findings -  Primary (Chronic)   Preventative protocols reviewed and updated unless pt declined. Discussed healthy diet and lifestyle.       Type 2 diabetes mellitus with other specified complication (HCC)   Dyslipidemia associated with type 2 diabetes mellitus (HCC)   Relevant Orders   CT CARDIAC SCORING (SELF PAY ONLY)   Chest discomfort   Relevant Orders   Troponin I   Other Visit Diagnoses       Special screening for malignant neoplasm of prostate            No orders of the defined types were placed in this encounter.   Orders Placed This Encounter  Procedures   CT CARDIAC SCORING (SELF PAY ONLY)    Standing Status:   Future    Expiration Date:   04/17/2025    Preferred imaging location?:   [facility]   Hemoglobin A1c    This external order was created through the Results Console.   Troponin I   HM DIABETES EYE EXAM    This external order was created through the Results Console.    Patient Instructions  Labs today  EKG today  Schedule appointment with dentist. Schedule appointment with the cardiologist.  Start walking routine I will order heart CT for further evaluation of coronary arteries.  Good to see you today  Return as needed or in 6 months for follow up visit.   Follow up plan: Return in about 6 months (around 10/15/2024) for follow up visit.  Anton Blas, MD

## 2024-04-17 NOTE — Assessment & Plan Note (Signed)
 Preventative protocols reviewed and updated unless pt declined. Discussed healthy diet and lifestyle.

## 2024-04-18 LAB — COMPREHENSIVE METABOLIC PANEL WITH GFR
ALT: 12 U/L (ref 0–53)
AST: 11 U/L (ref 0–37)
Albumin: 4.1 g/dL (ref 3.5–5.2)
Alkaline Phosphatase: 82 U/L (ref 39–117)
BUN: 12 mg/dL (ref 6–23)
CO2: 30 meq/L (ref 19–32)
Calcium: 9.5 mg/dL (ref 8.4–10.5)
Chloride: 101 meq/L (ref 96–112)
Creatinine, Ser: 0.93 mg/dL (ref 0.40–1.50)
GFR: 93.41 mL/min (ref >60.00)
Glucose, Bld: 151 mg/dL — ABNORMAL HIGH (ref 70–99)
Potassium: 3.9 meq/L (ref 3.5–5.1)
Sodium: 141 meq/L (ref 135–145)
Total Bilirubin: 0.5 mg/dL (ref 0.2–1.2)
Total Protein: 6.9 g/dL (ref 6.0–8.3)

## 2024-04-18 LAB — LIPID PANEL
Cholesterol: 133 mg/dL (ref 0–200)
HDL: 26.8 mg/dL — ABNORMAL LOW (ref >39.00)
LDL Cholesterol: 64 mg/dL (ref 0–99)
NonHDL: 105.94
Total CHOL/HDL Ratio: 5
Triglycerides: 208 mg/dL — ABNORMAL HIGH (ref 0.0–149.0)
VLDL: 41.6 mg/dL — ABNORMAL HIGH (ref 0.0–40.0)

## 2024-04-18 LAB — TROPONIN I: Troponin I: <3 ng/L (ref <=47)

## 2024-04-18 LAB — PSA: PSA: 1.29 ng/mL (ref 0.10–4.00)

## 2024-04-18 LAB — MICROALBUMIN / CREATININE URINE RATIO
Creatinine,U: 164.8 mg/dL
Microalb Creat Ratio: 6.7 mg/g (ref 0.0–30.0)
Microalb, Ur: 1.1 mg/dL (ref 0.0–1.9)

## 2024-04-18 LAB — VITAMIN B12: Vitamin B-12: 189 pg/mL — ABNORMAL LOW (ref 211–911)

## 2024-04-19 ENCOUNTER — Ambulatory Visit: Attending: Cardiovascular Disease | Admitting: Cardiovascular Disease

## 2024-04-19 ENCOUNTER — Ambulatory Visit: Payer: Self-pay | Admitting: Family Medicine

## 2024-04-19 DIAGNOSIS — E538 Deficiency of other specified B group vitamins: Secondary | ICD-10-CM | POA: Insufficient documentation

## 2024-04-19 DIAGNOSIS — R42 Dizziness and giddiness: Secondary | ICD-10-CM | POA: Insufficient documentation

## 2024-04-19 MED ORDER — VITAMIN B-12 1000 MCG PO TABS: 1000.0000 ug | ORAL_TABLET | Freq: Every day | ORAL | Status: AC

## 2024-04-19 NOTE — Assessment & Plan Note (Signed)
 Chronic, improving but remains above goal control, followed by endo on metformin  and glipizide.  Was unable to afford mounjaro

## 2024-04-19 NOTE — Assessment & Plan Note (Signed)
 By EKG

## 2024-04-19 NOTE — Assessment & Plan Note (Signed)
 Anticipate metformin  use contributes. Will offer IM replacement, and start oral b12 1000mcg daily.

## 2024-04-19 NOTE — Assessment & Plan Note (Signed)
 Overdue for cards f/u - encouraged schedule appt.

## 2024-04-19 NOTE — Assessment & Plan Note (Signed)
 20 lb weight loss since last visit.  Possibly due to hyperglycemia.  Continue to encourage healthy diet and lifestyle choices to effect sustainable weight loss.

## 2024-04-19 NOTE — Assessment & Plan Note (Signed)
 Update EKG.  Check TnI today.  Consider return to cards. Discussed cardiac CT - will order for further risk stratification as he is not on statin.

## 2024-04-19 NOTE — Assessment & Plan Note (Addendum)
 Chronic, not at goal. Did not tolerate doxazosin , avoid BB in 1st degree AVB.  He will continue amlodipine  and olmesartan /hctz.

## 2024-04-19 NOTE — Assessment & Plan Note (Signed)
 Chronic, update FLP off statin. Reviewed indication for statin in diabetes - he remains hesitant.  Agrees to check cardiac CT with calcium score for further risk stratification. The 10-year ASCVD risk score (Arnett DK, et al., 2019) is: 24.1%   Values used to calculate the score:     Age: 53 years     Clincally relevant sex: Male     Is Non-Hispanic African American: Yes     Diabetic: Yes     Tobacco smoker: No     Systolic Blood Pressure: 142 mmHg     Is BP treated: Yes     HDL Cholesterol: 26.8 mg/dL     Total Cholesterol: 133 mg/dL

## 2024-04-19 NOTE — Assessment & Plan Note (Addendum)
 Recent eval at ER 02/2024 - records reviewed. CTA head and neck reassuringly without significant arterial obstruction/stenosis.  This has since resolved.

## 2024-04-19 NOTE — Assessment & Plan Note (Signed)
 Continue OTC pepcid  BID

## 2024-04-19 NOTE — Assessment & Plan Note (Deleted)
 Update EKG.  Check TnI today.  Consider return to cards. Discussed cardiac CT - will order for further risk stratification as he is not on statin.

## 2024-04-19 NOTE — Assessment & Plan Note (Signed)
 Chronic, intolerant to CPAP.  Last saw pulm 05/2021.  Not good Inspire (HNS) candidate per ENT.  To consider oral appliance through dentist.

## 2024-05-08 ENCOUNTER — Encounter: Payer: Self-pay | Admitting: Family Medicine

## 2024-05-15 ENCOUNTER — Other Ambulatory Visit: Payer: Self-pay | Admitting: Family Medicine

## 2024-05-15 DIAGNOSIS — I10 Essential (primary) hypertension: Secondary | ICD-10-CM

## 2024-05-25 ENCOUNTER — Emergency Department (HOSPITAL_BASED_OUTPATIENT_CLINIC_OR_DEPARTMENT_OTHER)

## 2024-05-25 ENCOUNTER — Other Ambulatory Visit: Payer: Self-pay

## 2024-05-25 ENCOUNTER — Emergency Department (HOSPITAL_BASED_OUTPATIENT_CLINIC_OR_DEPARTMENT_OTHER)
Admission: EM | Admit: 2024-05-25 | Discharge: 2024-05-25 | Disposition: A | Discharge status: Home / Self Care | Attending: Emergency Medicine | Admitting: Emergency Medicine

## 2024-05-25 ENCOUNTER — Encounter (HOSPITAL_BASED_OUTPATIENT_CLINIC_OR_DEPARTMENT_OTHER): Payer: Self-pay | Admitting: Emergency Medicine

## 2024-05-25 DIAGNOSIS — R059 Cough, unspecified: Secondary | ICD-10-CM | POA: Diagnosis present

## 2024-05-25 DIAGNOSIS — I10 Essential (primary) hypertension: Secondary | ICD-10-CM | POA: Diagnosis not present

## 2024-05-25 DIAGNOSIS — Z79899 Other long term (current) drug therapy: Secondary | ICD-10-CM | POA: Diagnosis not present

## 2024-05-25 DIAGNOSIS — Z7984 Long term (current) use of oral hypoglycemic drugs: Secondary | ICD-10-CM | POA: Diagnosis not present

## 2024-05-25 DIAGNOSIS — J181 Lobar pneumonia, unspecified organism: Secondary | ICD-10-CM | POA: Diagnosis not present

## 2024-05-25 DIAGNOSIS — J189 Pneumonia, unspecified organism: Secondary | ICD-10-CM

## 2024-05-25 DIAGNOSIS — E119 Type 2 diabetes mellitus without complications: Secondary | ICD-10-CM | POA: Diagnosis not present

## 2024-05-25 LAB — RESP PANEL BY RT-PCR (RSV, FLU A&B, COVID)  RVPGX2
Influenza A by PCR: NEGATIVE
Influenza B by PCR: NEGATIVE
Resp Syncytial Virus by PCR: NEGATIVE
SARS Coronavirus 2 by RT PCR: NEGATIVE

## 2024-05-25 MED ORDER — AEROCHAMBER PLUS FLO-VU MEDIUM MISC
1.0000 | Freq: Once | Status: AC
  Administered 2024-05-25: 1

## 2024-05-25 MED ORDER — ALBUTEROL SULFATE HFA 108 (90 BASE) MCG/ACT IN AERS
1.0000 | INHALATION_SPRAY | Freq: Once | RESPIRATORY_TRACT | Status: AC
  Administered 2024-05-25: 1 via RESPIRATORY_TRACT
  Filled 2024-05-25: qty 6.7

## 2024-05-25 MED ORDER — DOXYCYCLINE HYCLATE 100 MG PO CAPS: 100.0000 mg | ORAL_CAPSULE | Freq: Two times a day (BID) | ORAL | 0 refills | Status: AC

## 2024-05-25 MED ORDER — AMOXICILLIN-POT CLAVULANATE 875-125 MG PO TABS: 1.0000 | ORAL_TABLET | Freq: Two times a day (BID) | ORAL | 0 refills | Status: DC

## 2024-05-25 NOTE — ED Provider Notes (Signed)
 [place] [facility] Provider Note   CSN: [id number] Arrival date & time: 05/25/24  1238     Patient presents with: Cough   Jonathan Compton is a 53 y.o. male.  Who presents emergency department chief complaint of cough.  He had onset of symptoms beginning this past Tuesday.  He describes onset of scratchy throat followed by persistent cough, cough is painful and productive of green phlegm.  He denies fever or chills but has had paroxysms of cough and difficulty catching his breath.  He denies posttussive emesis or fever.    Cough      Prior to Admission medications   Medication Sig Start Date End Date Taking? Authorizing Provider  amLODipine  (NORVASC ) 10 MG tablet TAKE 1 TABLET (10 MG TOTAL) BY MOUTH DAILY. FOR BLOOD PRESUSRE 03/08/24   Rilla Baller, MD  Blood Pressure Monitoring (BLOOD PRESSURE CUFF) MISC Use as directed to check blood pressures daily or as needed. XL cuff 11/26/18   Rilla Baller, MD  cyanocobalamin (VITAMIN B12) 1000 MCG tablet Take 1 tablet (1,000 mcg total) by mouth daily. 04/19/24   Rilla Baller, MD  famotidine  (PEPCID ) 20 MG tablet Take 1 tablet (20 mg total) by mouth 2 (two) times daily. 06/28/19   Rilla Baller, MD  glimepiride  (AMARYL ) 2 MG tablet TAKE 1 TABLET BY MOUTH EVERY DAY 03/12/24   Rilla Baller, MD  metFORMIN  (GLUCOPHAGE -XR) 500 MG 24 hr tablet Take 2 tablets (1,000 mg total) by mouth 2 (two) times daily with a meal. 07/30/22   Rilla Baller, MD  olmesartan -hydrochlorothiazide  (BENICAR  HCT) 40-25 MG tablet TAKE 1 TABLET BY MOUTH DAILY FOR BLOOD PRESSURE 05/16/24   Rilla Baller, MD    Allergies: Doxazosin     Review of Systems  Respiratory:  Positive for cough.     Updated Vital Signs BP (!) 153/90 (BP Location: Right Arm)   Pulse 87   Temp 98.2 F (36.8 C)   Resp 16   Ht 6' 2 (1.88 m)   Wt (!) 140.6 kg   SpO2 97%   BMI 39.80 kg/m   Physical Exam Vitals and nursing note  reviewed.  Constitutional:      General: He is not in acute distress.    Appearance: He is well-developed. He is not diaphoretic.  HENT:     Head: Normocephalic and atraumatic.  Eyes:     General: No scleral icterus.    Conjunctiva/sclera: Conjunctivae normal.  Cardiovascular:     Rate and Rhythm: Normal rate and regular rhythm.     Heart sounds: Normal heart sounds.  Pulmonary:     Effort: Pulmonary effort is normal. No respiratory distress.     Breath sounds: Normal breath sounds. No wheezing or rhonchi.  Abdominal:     Palpations: Abdomen is soft.     Tenderness: There is no abdominal tenderness.  Musculoskeletal:     Cervical back: Normal range of motion and neck supple.  Skin:    General: Skin is warm and dry.  Neurological:     Mental Status: He is alert.  Psychiatric:        Behavior: Behavior normal.     (all labs ordered are listed, but only abnormal results are displayed) Labs Reviewed  RESP PANEL BY RT-PCR (RSV, FLU A&B, COVID)  [id number]    EKG: None  Radiology: No results found.   Procedures   Medications Ordered in the ED - No data to display  Medical Decision Making 53 year old male with a history of diabetes and hypertension who presents emergency department with chief complaint of persistent cough for the past several days.  Differential diagnosis for emergent cause of cough includes but is not limited to upper respiratory infection, lower respiratory infection, allergies, asthma, irritants, foreign body, medications such as ACE inhibitors, reflux, asthma, CHF, lung cancer, interstitial lung disease, psychiatric causes, postnasal drip and postinfectious bronchospasm.  I ordered respiratory panel which is negative  I independently interpreted chest x-ray which shows a right upper lobe opacity consistent with pneumonia  Per IDSA guidelines patient will be covered with Augmentin  and doxycycline.  I also believe he has  likely associated bronchospasm and bronchitis and will treat with albuterol .  Discussed outpatient follow-up and return precautions with the patient.  Work note provided.  Appropriate for discharge at this time without hypoxia or evidence of respiratory distress.  Amount and/or Complexity of Data Reviewed Labs: ordered.    Details: Negative respiratory panel Radiology: ordered and independent interpretation performed.  Risk Prescription drug management.        Final diagnoses:  Community acquired pneumonia of right upper lobe of lung    ED Discharge Orders     None          Arloa Chroman, PA-C 05/25/24 1355    Rogelia Jerilynn RAMAN, MD 05/25/24 1400

## 2024-05-25 NOTE — Discharge Instructions (Signed)
###   Pneumonia Discharge Instructions     **You are being treated for community-acquired pneumonia (CAP), which means a lung infection that started outside the hospital. Because you have diabetes, you are at higher risk for complications, so it is important to follow these instructions carefully.**[1][2]      **Medications:**      - **Amoxicillin -clavulanate**: This is an antibiotic that helps fight the bacteria causing your pneumonia. Take it exactly as prescribed, usually twice a day, until you finish the course--even if you start feeling better.      - **Doxycycline**: This is another antibiotic that works with amoxicillin -clavulanate to cover different types of bacteria. Take it as directed, usually twice a day.      - **Albuterol  inhaler**: Use two puffs with a spacer every four hours as needed to help with breathing. If you feel short of breath, this medicine can help open your airways.      - **Over-the-counter cold and flu medications**: These can help with symptoms like cough, congestion, or fever. Use as directed on the package, and avoid taking more than one product with the same ingredient.      **What to Expect:**      - Most people start to feel better within a few days, but it is important to finish all antibiotics.[3]      - You may still have some cough and tiredness for a few weeks.      **Strict Return Precautions:**      Call your doctor or go to the emergency room right away if you:      - Have trouble breathing or feel short of breath at rest      - Have chest pain that is new or getting worse      - Are coughing up blood      - Have a high fever (over 102F) that does not go down with medicine      - Feel confused, very drowsy, or faint      - Cannot keep down food or fluids due to vomiting      - Notice your blood sugar is very high or very low and you cannot control it      **Other Important Tips:**      - Take your diabetes medications as prescribed  and check your blood sugar regularly, as infections can make it harder to control.      - Drink plenty of fluids and rest as much as possible.      - Avoid smoking and alcohol while you recover.      - If you have any questions about your medications or symptoms, contact your healthcare provider.      **Follow-up:**      - Schedule a follow-up appointment as directed to make sure you are recovering well.      **Remember:** Taking your medications as prescribed and watching for warning signs can help you recover safely from pneumonia.[1][2]      ### References  1. Diagnosis and Treatment of Adults With Community-Acquired Pneumonia. An Official Clinical Practice Guideline of the American Thoracic Society and Infectious Diseases Society of America. Metlay JP, Waterer GW, Long AC, et al. American Journal of Respiratory and Critical Care Medicine. 2019;200(7):e45-e67. doi:10.1164/rccm.724-134-0461. 2. Community-Acquired Pneumonia. File TM, Rubin SHILLING. The New England Journal of Medicine. 2023;389(7):632-641. doi:10.1056/NEJMcp2303286. 3. Community-Acquired Pneumonia: Updated Recommendations from the ATS and IDSA. American Academy of [employer] [phone]).

## 2024-05-25 NOTE — ED Notes (Signed)
 Patient transported to X-ray

## 2024-05-25 NOTE — ED Triage Notes (Signed)
 Pt c/o cough x 3-4 days, productive with green mucous

## 2024-05-29 ENCOUNTER — Encounter: Payer: Self-pay | Admitting: Family Medicine

## 2024-05-29 ENCOUNTER — Telehealth: Payer: Self-pay | Admitting: Family Medicine

## 2024-05-29 DIAGNOSIS — E1169 Type 2 diabetes mellitus with other specified complication: Secondary | ICD-10-CM

## 2024-05-29 NOTE — Telephone Encounter (Signed)
 Patient dropped off 2 FMLA forms he needs one for continuous and one for intermittent leave faxed to his employer. Placed in box for FMLA

## 2024-05-29 NOTE — Telephone Encounter (Signed)
 Do you need to see in office to address concerns for fmla?

## 2024-05-31 NOTE — Telephone Encounter (Signed)
 FMLA for Patient forms received for completion for patient. Patient has been informed that process may take up to 5 business days.  Employer Name  Reason for being out: Continuous leave for Pneumonia                                      Intermittent leave for Diabetes  Any inpatient care:N/A Any Planned appointment? 3.23.26  Continuous leave: Patient is requesting start date of 81.3.25 Patient is requesting end date of  11.11.25    Intermittent leave from 11.12.25 - 11.12.26  Verified with patient that it is ok to leave Voicemail updates on  Mobile [phone] (mobile)  Patient would like to pick up copy in our office when form is completed.   Fax number form should be sent to is [phone] [employer]   Forms placed in providers box for review

## 2024-06-03 ENCOUNTER — Ambulatory Visit (HOSPITAL_BASED_OUTPATIENT_CLINIC_OR_DEPARTMENT_OTHER)
Admission: RE | Admit: 2024-06-03 | Discharge: 2024-06-03 | Disposition: A | Discharge status: Home / Self Care | Payer: Self-pay | Source: Ambulatory Visit | Attending: Family Medicine | Admitting: Family Medicine

## 2024-06-03 ENCOUNTER — Ambulatory Visit: Admitting: Cardiovascular Disease

## 2024-06-03 DIAGNOSIS — E1169 Type 2 diabetes mellitus with other specified complication: Secondary | ICD-10-CM | POA: Insufficient documentation

## 2024-06-03 DIAGNOSIS — E785 Hyperlipidemia, unspecified: Secondary | ICD-10-CM | POA: Insufficient documentation

## 2024-06-04 DIAGNOSIS — Z0279 Encounter for issue of other medical certificate: Secondary | ICD-10-CM

## 2024-06-04 NOTE — Addendum Note (Signed)
 Addended by: RILLA BALLER on: 06/04/2024 02:09 PM   Modules accepted: Orders

## 2024-06-04 NOTE — Telephone Encounter (Signed)
 Patient called today,11.11.25, asking for update on his FMLA paperwork. I informed him that as of today, I have not received the forms, but I would reach out to him when I did.

## 2024-06-04 NOTE — Telephone Encounter (Signed)
 Completed continuous and intermittent forms received and faxed together to [employer] at [phone]  Copy sent to scan  Notified patient via phone call that forms are complete and faxed. Mailed copy to patient as requested.

## 2024-06-04 NOTE — Telephone Encounter (Signed)
Spoke with patient. See phone note.

## 2024-06-04 NOTE — Telephone Encounter (Addendum)
 Filled and returned to [place].   I tried calling patient, went to voicemail. Left message asking for a call back.  I did speak with patient.  I will write out 1-2 days per month. Predominant concern is side effects from Metformin  - loose stools worse in the mornings.   Reviewed that if he's having significant side effects to his medications that require him to be out of work consistently, he needs to discuss this with me or Dr Balan to come up with a modified treatment plan. Recommend he schedule office visit with me if he wants to further discuss this.   Reviewing last endocrinology note, current regimen should be:  1. Metformin  XR 500mg  1 tablet twice daily  2. Glimepiride  4mg  1 tablet twice daily with meals.

## 2024-06-05 ENCOUNTER — Other Ambulatory Visit: Payer: Self-pay | Admitting: Family Medicine

## 2024-06-05 DIAGNOSIS — I10 Essential (primary) hypertension: Secondary | ICD-10-CM

## 2024-07-01 ENCOUNTER — Encounter: Payer: Self-pay | Admitting: Cardiovascular Disease

## 2024-07-01 ENCOUNTER — Ambulatory Visit: Attending: Cardiovascular Disease | Admitting: Cardiovascular Disease

## 2024-07-01 VITALS — BP 152/90 | HR 89 | Ht 74.0 in | Wt 314.8 lb

## 2024-07-01 DIAGNOSIS — I10 Essential (primary) hypertension: Secondary | ICD-10-CM

## 2024-07-01 DIAGNOSIS — E1169 Type 2 diabetes mellitus with other specified complication: Secondary | ICD-10-CM | POA: Diagnosis not present

## 2024-07-01 DIAGNOSIS — E785 Hyperlipidemia, unspecified: Secondary | ICD-10-CM

## 2024-07-01 DIAGNOSIS — E66813 Obesity, class 3: Secondary | ICD-10-CM | POA: Diagnosis not present

## 2024-07-01 DIAGNOSIS — Z8679 Personal history of other diseases of the circulatory system: Secondary | ICD-10-CM | POA: Diagnosis not present

## 2024-07-01 NOTE — Patient Instructions (Signed)
 Medication Instructions:  Your physician recommends that you continue on your current medications as directed. Please refer to the Current Medication list given to you today.  *If you need a refill on your cardiac medications before your next appointment, please call your pharmacy*  Follow-Up: At [facility], you and your health needs are our priority.  As part of our continuing mission to provide you with exceptional heart care, our providers are all part of one team.  This team includes your primary Cardiologist (physician) and Advanced Practice Providers or APPs (Physician Assistants and Nurse Practitioners) who all work together to provide you with the care you need, when you need it.  Your next appointment:    March 2026  Provider:   One of our Advanced Practice Providers (APPs): Morse Clause, PA-C  Lamarr Satterfield, NP Miriam Shams, NP  Olivia Pavy, PA-C Josefa Beauvais, NP  Leontine Salen, PA-C Orren Fabry, PA-C  [place], PA-C Ernest Dick, NP  Damien Braver, NP Jon Hails, PA-C  Waddell Donath, PA-C    Dayna Dunn, PA-C  Scott Weaver, PA-C Lum Louis, NP Katlyn West, NP Callie Goodrich, PA-C  Xika Zhao, NP Sheng Haley, PA-C    Kathleen Johnson, PA-C

## 2024-07-01 NOTE — Assessment & Plan Note (Signed)
 Blood pressure is uncontrolled.  I reviewed his medication regimen and he is on amlodipine  10 mg, olmesartan /HCT 40/25 mg.  I recommended that he start carvedilol and was planning on starting 6.25 mg twice daily.  After shared decision making conversation, he declines this at present.  He wants to work hard on diet and weight loss over the next 3 months.  On my recheck his blood pressure today is 152/90 mmHg.  He understands the impact that weight loss can have on his blood pressure.  We discussed sodium restriction.  If he remains above goal he would be willing to start carvedilol.  I reviewed his EKGs and I do not have much concern over use of a beta-blocker with his first-degree AV block.

## 2024-07-01 NOTE — Assessment & Plan Note (Signed)
 No active symptoms.  No recurrence.

## 2024-07-01 NOTE — Assessment & Plan Note (Signed)
 Coronary calcium score 0.  Not on any therapy at this time.  Reluctant to take a statin drug.  LDL is 64.

## 2024-07-01 NOTE — Progress Notes (Signed)
 Cardiology Office Note:    Date:  07/01/2024   ID:  Jonathan Compton, DOB 1971-03-10, MRN [id number]  PCP:  Jonathan Baller, MD   [facility] Providers Cardiologist:  Jonathan Fell, MD     Referring MD: Jonathan Baller, MD   Chief Complaint  Patient presents with   Hypertension    History of Present Illness:    Jonathan Compton is a 53 y.o. male with a hx of:  History of pericarditis Presented in 2018 with chest pain and ST elevation suggestive of anterolateral injury Cath 10/05/16: Normal coronary arteries>>dx with pericarditis TTE 10/06/2016 EF 55-60, no RWMA Myoview  04/07/2020: Apical thinning, no ischemia, EF 48, low risk Hypertension Sleep apnea Diabetes mellitus Obesity Coronary calcium score = 0 2025  The patient is here alone today.  He is doing okay with no symptoms of chest pain, chest pressure, leg swelling, orthopnea, PND, or heart palpitations.  Current Medications: Current Meds  Medication Sig   amLODipine  (NORVASC ) 10 MG tablet TAKE 1 TABLET (10 MG TOTAL) BY MOUTH DAILY. FOR BLOOD PRESUSRE   Blood Pressure Monitoring (BLOOD PRESSURE CUFF) MISC Use as directed to check blood pressures daily or as needed. XL cuff   cyanocobalamin  (VITAMIN B12) 1000 MCG tablet Take 1 tablet (1,000 mcg total) by mouth daily.   famotidine  (PEPCID ) 20 MG tablet Take 1 tablet (20 mg total) by mouth 2 (two) times daily.   glimepiride  (AMARYL ) 4 MG tablet Take 1 tablet (4 mg total) by mouth in the morning and at bedtime.   metFORMIN  (GLUCOPHAGE -XR) 500 MG 24 hr tablet Take 1 tablet (500 mg total) by mouth 2 (two) times daily with a meal.   olmesartan -hydrochlorothiazide  (BENICAR  HCT) 40-25 MG tablet TAKE 1 TABLET BY MOUTH DAILY FOR BLOOD PRESSURE     Allergies:   Doxazosin    ROS:   Please see the history of present illness.    All other systems reviewed and are negative.  EKGs/Labs/Other Studies Reviewed:    The following studies were reviewed today: Cardiac  Studies & Procedures   ______________________________________________________________________________________________ CARDIAC CATHETERIZATION  CARDIAC CATHETERIZATION 10/05/2016  Conclusion 1. Widely patent coronary arteries with no obstructive disease 2. Slow flow in the LAD 3. Normal LV function  The patient does not appear to have any evidence of an acute coronary syndrome. I suspect he has acute carditis. He will be treated accordingly with nonsteroidal anti-inflammatory drugs and colchicine . Will admit overnight.  Findings Coronary Findings Diagnostic  Dominance: Right  Left Anterior Descending Vessel is angiographically normal. Patent vessel but slow flow present  Ramus Intermedius Vessel is angiographically normal.  Left Circumflex Vessel is angiographically normal.  Right Coronary Artery Vessel is angiographically normal.  Intervention  No interventions have been documented.   STRESS TESTS  MYOCARDIAL PERFUSION IMAGING 04/07/2020  Interpretation Summary  Nuclear stress EF: 48%.  There was no ST segment deviation noted during stress.  Defect 1: There is a small defect of severe severity present in the apex location.  This is a low risk study.  The left ventricular ejection fraction is mildly decreased (45-54%).  Mildly abnormal, low risk stress nuclear study with apical thinning but no ischemia.  Gated ejection fraction 48% with mild global hypokinesis.  Mild left ventricular enlargement.   ECHOCARDIOGRAM  ECHOCARDIOGRAM COMPLETE 10/06/2016  Narrative *Jonathan Compton* *Jonathan Compton [facility]* [address] [place], [place] [address] [phone]  ------------------------------------------------------------------- Transthoracic Echocardiography  Patient:    Jonathan Compton MR #:  [id number] Study Date: 10/06/2016 Gender:     M Age:        72 Height:     188 cm Weight:     148.3 kg BSA:        2.85 m^2 Pt. Status: Room:        4NP13C  ADMITTING    Jonathan Fell, MD ATTENDING    Jonathan Fell, MD ORDERING     Jonathan Fell, MD REFERRING    Jonathan Fell, MD SONOGRAPHER  Jonathan Compton PERFORMING   [facility], Inpatient  cc:  ------------------------------------------------------------------- LV EF: 55% -   60%  ------------------------------------------------------------------- Indications:      Chest pain 786.51.  ------------------------------------------------------------------- History:   PMH:  Acute pericarditis. Uncontrolled diabetes. Morbid obesity.  ------------------------------------------------------------------- Study Conclusions  - Left ventricle: The cavity size was normal. Systolic function was normal. The estimated ejection fraction was in the range of 55% to 60%. Wall motion was normal; there were no regional wall motion abnormalities. Left ventricular diastolic function parameters were normal. - Aortic valve: Trileaflet; normal thickness, mildly calcified leaflets. - Mitral valve: Calcified annulus. - Pulmonic valve: There was no regurgitation. - Pulmonary arteries: Systolic pressure could not be accurately estimated.  ------------------------------------------------------------------- Labs, prior tests, procedures, and surgery: Catheterization (current admission).    The study excluded coronary artery disease.  ------------------------------------------------------------------- Study data:  Comparison was made to the study of 10/02/2015.  Study status:  Routine.  Procedure:  The patient reported no pain pre or post test. Transthoracic echocardiography. Image quality was adequate.  Study completion:  There were no complications. Transthoracic echocardiography.  M-mode, complete 2D, spectral Doppler, and color Doppler.  Birthdate:  Patient birthdate: 1971/07/18.  Age:  Patient is 53 yr old.  Sex:  Gender: male. BMI: 42 kg/m^2.  Blood pressure:     119/82  Patient  status: Inpatient.  Study date:  Study date: 10/06/2016. Study time: 12:06 PM.  Location:  ICU/CCU  -------------------------------------------------------------------  ------------------------------------------------------------------- Left ventricle:  The cavity size was normal. Systolic function was normal. The estimated ejection fraction was in the range of 55% to 60%. Wall motion was normal; there were no regional wall motion abnormalities. The transmitral flow pattern was normal. The deceleration time of the early transmitral flow velocity was normal. The pulmonary vein flow pattern was normal. The tissue Doppler parameters were normal. Left ventricular diastolic function parameters were normal.  ------------------------------------------------------------------- Aortic valve:   Trileaflet; normal thickness, mildly calcified leaflets. Mobility was not restricted.  Doppler:  Transvalvular velocity was within the normal range. There was no stenosis. There was no regurgitation.  ------------------------------------------------------------------- Aorta:  Aortic root: The aortic root was normal in size.  ------------------------------------------------------------------- Mitral valve:   Calcified annulus. Mobility was not restricted. Doppler:  Transvalvular velocity was within the normal range. There was no evidence for stenosis. There was no regurgitation.  ------------------------------------------------------------------- Left atrium:  The atrium was normal in size.  ------------------------------------------------------------------- Right ventricle:  The cavity size was normal. Wall thickness was normal. Systolic function was normal.  ------------------------------------------------------------------- Pulmonic valve:    Structurally normal valve.   Cusp separation was normal.  Doppler:  Transvalvular velocity was within the normal range. There was no evidence for  stenosis. There was no regurgitation.  ------------------------------------------------------------------- Tricuspid valve:   Structurally normal valve.    Doppler: Transvalvular velocity was within the normal range. There was no regurgitation.  ------------------------------------------------------------------- Pulmonary artery:   The main pulmonary artery was normal-sized. Systolic pressure could not be accurately estimated.  ------------------------------------------------------------------- Right atrium:  The  atrium was normal in size.  ------------------------------------------------------------------- Pericardium:  The pericardium was normal in appearance. There was no pericardial effusion.  ------------------------------------------------------------------- Systemic veins: Inferior vena cava: The vessel was normal in size.  ------------------------------------------------------------------- Measurements  Left ventricle                           Value        Reference LV ID, ED, PLAX chordal                  49    mm     43 - 52 LV ID, ES, PLAX chordal                  34    mm     23 - 38 LV fx shortening, PLAX chordal           31    %      >=29 LV PW thickness, ED                      12    mm     ---------- IVS/LV PW ratio, ED                      0.92         <=1.3 Stroke volume, 2D                        67    ml     ---------- Stroke volume/bsa, 2D                    24    ml/m^2 ---------- LV e&', lateral                           8.7   cm/s   ---------- LV e&', medial                            8.27  cm/s   ---------- LV e&', average                           8.49  cm/s   ----------  Ventricular septum                       Value        Reference IVS thickness, ED                        11    mm     ----------  LVOT                                     Value        Reference LVOT ID, S                               22    mm     ---------- LVOT area  3.8   cm^2   ---------- LVOT peak velocity, S                    95.9  cm/s   ---------- LVOT mean velocity, S                    71.3  cm/s   ---------- LVOT VTI, S                              17.7  cm     ---------- LVOT peak gradient, S                    4     mm Hg  ----------  Aorta                                    Value        Reference Aortic root ID, ED                       32    mm     ----------  Left atrium                              Value        Reference LA ID, A-P, ES                           37    mm     ---------- LA ID/bsa, A-P                           1.3   cm/m^2 <=2.2 LA volume, S                             64.7  ml     ---------- LA volume/bsa, S                         22.7  ml/m^2 ---------- LA volume, ES, 1-p A4C                   64.8  ml     ---------- LA volume/bsa, ES, 1-p A4C               22.8  ml/m^2 ---------- LA volume, ES, 1-p A2C                   62.5  ml     ---------- LA volume/bsa, ES, 1-p A2C               22    ml/m^2 ----------  Mitral valve                             Value        Reference Mitral deceleration time       (H)       232   ms     150 - 230 Mitral E/A ratio, peak  1.3          ----------  Right atrium                             Value        Reference RA ID, S-I, ES, A4C                      48.3  mm     34 - 49 RA area, ES, A4C                         18.3  cm^2   8.3 - 19.5 RA volume, ES, A/L                       51.3  ml     ---------- RA volume/bsa, ES, A/L                   18    ml/m^2 ----------  Right ventricle                          Value        Reference TAPSE                                    22    mm     ---------- RV s&', lateral, S                        9.68  cm/s   ----------  Legend: (L)  and  (H)  mark values outside specified reference range.  ------------------------------------------------------------------- Prepared and Electronically Authenticated  by  Wilbert Bihari, MD 2018-03-15T14:21:23      CT SCANS  CT CARDIAC SCORING (SELF PAY ONLY) 06/03/2024  Addendum 06/05/2024  1:29 PM ADDENDUM REPORT: 06/05/2024 13:26  ADDENDUM: The following report is an over-read performed by radiologist Dr. Reyes Holder of [facility] on 06/05/2024. This over-read does not include interpretation of cardiac or coronary anatomy or pathology. The coronary calcium score interpretation by the cardiologist is attached.  COMPARISON:  None.  FINDINGS: Vascular: No acute non-cardiac vascular finding.  Mediastinum/Nodes: No pathologically enlarged mediastinal, or hilar lymph nodes, noting limited sensitivity for the detection of hilar adenopathy on this noncontrast study. Visualized portions of the esophagus are grossly unremarkable  Lungs/Pleura: Scattered linear bands of atelectasis/scarring. A few small pulmonary nodules for instance measuring 4 mm on image 11/303.  Upper Abdomen: No acute abnormality.  Musculoskeletal: Thoracic spondylosis.  IMPRESSION: 1. No acute non-cardiac vascular finding. 2. A few small pulmonary nodules measuring up to 4 mm. Consider follow-up dedicated chest CT.   Electronically Signed By: Reyes Holder M.D. On: 06/05/2024 13:26  Narrative : CLINICAL DATA:  Cardiovascular Disease Risk stratification  EXAM:  Coronary Calcium Score  TECHNIQUE:  A gated, non-contrast computed tomography scan of the heart was  performed using 3mm slice thickness. Axial images were analyzed on a  dedicated workstation. Calcium scoring of the coronary arteries was  performed using the Agatston method.  FINDINGS:  Coronary Calcium Score:  Left main: 0  Left anterior descending artery: 0  Left circumflex artery: 0  Right coronary artery: 0  Total: 0  Percentile: 0  Pericardium: Normal.  Ascending Aorta:  Normal caliber.  Pulmonary artery: Normal caliber  Non-cardiac: See separate report  from [facility].  IMPRESSION:  Coronary calcium score of 0.  RECOMMENDATIONS:  Coronary artery calcium (CAC) score is a strong predictor of  incident coronary heart disease (CHD) and provides predictive  information beyond traditional risk factors. CAC scoring is  reasonable to use in the decision to withhold, postpone, or initiate  statin therapy in intermediate-risk or selected borderline-risk  asymptomatic adults (age 44-75 years and LDL-C >=70 to <190 mg/dL)  who do not have diabetes or established atherosclerotic  cardiovascular disease (ASCVD).* In intermediate-risk (10-year ASCVD  risk >=7.5% to <20%) adults or selected borderline-risk (10-year  ASCVD risk >=5% to <7.5%) adults in whom a CAC score is measured for  the purpose of making a treatment decision the following  recommendations have been made:  If CAC=0, it is reasonable to withhold statin therapy and reassess  in 5 to 10 years, as long as higher risk conditions are absent  (diabetes mellitus, family history of premature CHD in first degree  relatives (males <55 years; females <65 years), cigarette smoking,  or LDL >=190 mg/dL).  If CAC is 1 to 99, it is reasonable to initiate statin therapy for  patients >=59 years of age.  If CAC is >=100 or >=75th percentile, it is reasonable to initiate  statin therapy at any age.  Cardiology referral should be considered for patients with CAC  scores >=400 or >=75th percentile.  *2018 AHA/ACC/AACVPR/AAPA/ABC/ACPM/ADA/AGS/APhA/ASPC/NLA/PCNA  Guideline on the Management of Blood Cholesterol: A Report of the  American College of Cardiology/American Heart Association Task Force  on Clinical Practice Guidelines. J Am Coll Cardiol.  2019;73(24):3168-3209.  Electronically Signed: By: Lamar Fitch M.D. On: 06/04/2024 13:19     ______________________________________________________________________________________________      EKG:         Recent Labs: 04/17/2024: ALT 12; BUN 12; Creatinine, Ser 0.93; Potassium 3.9; Sodium 141  Recent Lipid Panel    Component Value Date/Time   CHOL 133 04/17/2024 1554   TRIG 208.0 (H) 04/17/2024 1554   HDL 26.80 (L) 04/17/2024 1554   CHOLHDL 5 04/17/2024 1554   VLDL 41.6 (H) 04/17/2024 1554   LDLCALC 64 04/17/2024 1554   LDLCALC 87 11/23/2018 1453   LDLDIRECT 79.0 07/26/2022 0726     Risk Assessment/Calculations:      HYPERTENSION CONTROL Vitals:   07/01/24 1407 07/01/24 1440  BP: (!) 166/100 (!) 152/90    The patient's blood pressure is elevated above target today.  In order to address the patient's elevated BP: Blood pressure will be monitored at home to determine if medication changes need to be made.            Physical Exam:    VS:  BP (!) 152/90   Pulse 89   Ht 6' 2 (1.88 m)   Wt (!) 314 lb 12.8 oz (142.8 kg)   SpO2 96%   BMI 40.42 kg/m     Wt Readings from Last 3 Encounters:  07/01/24 (!) 314 lb 12.8 oz (142.8 kg)  05/25/24 (!) 310 lb (140.6 kg)  04/17/24 (!) 300 lb 2 oz (136.1 kg)     GEN:  Well nourished, well developed in no acute distress HEENT: Normal NECK: No JVD; No carotid bruits LYMPHATICS: No lymphadenopathy CARDIAC: RRR, no murmurs, rubs, gallops RESPIRATORY:  Clear to auscultation without rales, wheezing or rhonchi  ABDOMEN: Soft, non-tender, non-distended MUSCULOSKELETAL:  No edema; No deformity  SKIN: Warm and dry NEUROLOGIC:  Alert and oriented x 3 PSYCHIATRIC:  Normal affect   Assessment & Plan Essential hypertension Blood pressure is uncontrolled.  I reviewed his medication regimen and he is on amlodipine  10 mg, olmesartan /HCT 40/25 mg.  I recommended that he start carvedilol and was planning on starting 6.25 mg twice daily.  After shared decision making conversation, he declines this at present.  He wants to work hard on diet and weight loss over the next 3 months.  On my recheck his blood pressure today is 152/90 mmHg.  He  understands the impact that weight loss can have on his blood pressure.  We discussed sodium restriction.  If he remains above goal he would be willing to start carvedilol.  I reviewed his EKGs and I do not have much concern over use of a beta-blocker with his first-degree AV block. Dyslipidemia associated with type 2 diabetes mellitus (HCC) Coronary calcium score 0.  Not on any therapy at this time.  Reluctant to take a statin drug.  LDL is 64. Obesity, Class III, BMI 40-49.9 (morbid obesity) (HCC) Counseling/weight loss recommendations reviewed History of pericarditis No active symptoms.  No recurrence.            Medication Adjustments/Labs and Tests Ordered: Current medicines are reviewed at length with the patient today.  Concerns regarding medicines are outlined above.  No orders of the defined types were placed in this encounter.  No orders of the defined types were placed in this encounter.   Patient Instructions  Medication Instructions:  Your physician recommends that you continue on your current medications as directed. Please refer to the Current Medication list given to you today.  *If you need a refill on your cardiac medications before your next appointment, please call your pharmacy*  Follow-Up: At [facility], you and your health needs are our priority.  As part of our continuing mission to provide you with exceptional heart care, our providers are all part of one team.  This team includes your primary Cardiologist (physician) and Advanced Practice Providers or APPs (Physician Assistants and Nurse Practitioners) who all work together to provide you with the care you need, when you need it.  Your next appointment:    March 2026  Provider:   One of our Advanced Practice Providers (APPs): Morse Clause, PA-C  Lamarr Satterfield, NP Miriam Shams, NP  Olivia Pavy, PA-C Josefa Beauvais, NP  Leontine Salen, PA-C Orren Fabry, PA-C  [place], PA-C Ernest Dick,  NP  Damien Braver, NP Jon Hails, PA-C  Waddell Donath, PA-C    Dayna Dunn, PA-C  Scott Weaver, PA-C Lum Louis, NP Katlyn West, NP Callie Goodrich, PA-C  Xika Zhao, NP Thom Sluder, PA-C    Kathleen Johnson, PA-C   Signed, Jonathan Fell, MD  07/01/2024 4:06 PM     [facility]

## 2024-09-02 ENCOUNTER — Ambulatory Visit: Admitting: Cardiovascular Disease

## 2024-09-30 ENCOUNTER — Ambulatory Visit: Admitting: Emergency Medicine

## 2024-10-14 ENCOUNTER — Ambulatory Visit: Admitting: Family Medicine
